# Patient Record
Sex: Male | Born: 1955 | Race: White | Hispanic: No | Marital: Married | Smoking: Current every day smoker
Health system: Southern US, Community
[De-identification: ages and names within clinical notes are randomized; demographics above are authoritative.]

## PROBLEM LIST (undated history)

## (undated) DIAGNOSIS — D471 Chronic myeloproliferative disease: Secondary | ICD-10-CM

## (undated) DIAGNOSIS — G8929 Other chronic pain: Secondary | ICD-10-CM

## (undated) DIAGNOSIS — E1140 Type 2 diabetes mellitus with diabetic neuropathy, unspecified: Secondary | ICD-10-CM

## (undated) DIAGNOSIS — F419 Anxiety disorder, unspecified: Secondary | ICD-10-CM

## (undated) DIAGNOSIS — R112 Nausea with vomiting, unspecified: Secondary | ICD-10-CM

## (undated) DIAGNOSIS — E785 Hyperlipidemia, unspecified: Secondary | ICD-10-CM

## (undated) DIAGNOSIS — E119 Type 2 diabetes mellitus without complications: Secondary | ICD-10-CM

## (undated) DIAGNOSIS — I10 Essential (primary) hypertension: Secondary | ICD-10-CM

## (undated) DIAGNOSIS — M1990 Unspecified osteoarthritis, unspecified site: Secondary | ICD-10-CM

## (undated) DIAGNOSIS — Z9889 Other specified postprocedural states: Secondary | ICD-10-CM

## (undated) DIAGNOSIS — M5030 Other cervical disc degeneration, unspecified cervical region: Secondary | ICD-10-CM

## (undated) DIAGNOSIS — C649 Malignant neoplasm of unspecified kidney, except renal pelvis: Secondary | ICD-10-CM

## (undated) DIAGNOSIS — M549 Dorsalgia, unspecified: Secondary | ICD-10-CM

---

## 2004-10-11 ENCOUNTER — Emergency Department (HOSPITAL_COMMUNITY): Admission: EM | Admit: 2004-10-11 | Discharge: 2004-10-11 | Payer: Self-pay | Admitting: Family Medicine

## 2014-03-04 ENCOUNTER — Encounter (HOSPITAL_COMMUNITY): Payer: Self-pay | Admitting: Pharmacy Technician

## 2014-03-07 ENCOUNTER — Other Ambulatory Visit: Payer: Self-pay

## 2014-03-07 ENCOUNTER — Encounter (HOSPITAL_COMMUNITY)
Admission: RE | Admit: 2014-03-07 | Discharge: 2014-03-07 | Disposition: A | Discharge status: Home / Self Care | Payer: BC Managed Care – PPO | Source: Ambulatory Visit | Attending: Ophthalmology | Admitting: Ophthalmology

## 2014-03-07 ENCOUNTER — Encounter (HOSPITAL_COMMUNITY): Payer: Self-pay

## 2014-03-07 DIAGNOSIS — Z01810 Encounter for preprocedural cardiovascular examination: Secondary | ICD-10-CM | POA: Insufficient documentation

## 2014-03-07 DIAGNOSIS — Z01812 Encounter for preprocedural laboratory examination: Secondary | ICD-10-CM | POA: Insufficient documentation

## 2014-03-07 HISTORY — DX: Unspecified osteoarthritis, unspecified site: M19.90

## 2014-03-07 HISTORY — DX: Hyperlipidemia, unspecified: E78.5

## 2014-03-07 HISTORY — DX: Type 2 diabetes mellitus with diabetic neuropathy, unspecified: E11.40

## 2014-03-07 HISTORY — DX: Type 2 diabetes mellitus without complications: E11.9

## 2014-03-07 HISTORY — DX: Essential (primary) hypertension: I10

## 2014-03-07 LAB — BASIC METABOLIC PANEL
BUN: 11 mg/dL (ref 6–23)
CHLORIDE: 100 meq/L (ref 96–112)
CO2: 25 mEq/L (ref 19–32)
Calcium: 9.7 mg/dL (ref 8.4–10.5)
Creatinine, Ser: 0.68 mg/dL (ref 0.50–1.35)
GFR calc non Af Amer: >90 mL/min (ref >90)
Glucose, Bld: 160 mg/dL — ABNORMAL HIGH (ref 70–99)
POTASSIUM: 4.9 meq/L (ref 3.7–5.3)
Sodium: 139 mEq/L (ref 137–147)

## 2014-03-07 LAB — HEMOGLOBIN AND HEMATOCRIT, BLOOD
HEMATOCRIT: 44.8 % (ref 39.0–52.0)
HEMOGLOBIN: 15.2 g/dL (ref 13.0–17.0)

## 2014-03-07 NOTE — Patient Instructions (Signed)
Tyrone Cohen  03/07/2014   Your procedure is scheduled on:  03/14/14  Report to [facility] at [place] AM.  Call this number if you have problems the morning of surgery: [phone]   Remember:   Do not eat food or drink liquids after midnight.   Take these medicines the morning of surgery with A SIP OF WATER: lisinopril, oxycodone   Do not wear jewelry, make-up or nail polish.  Do not wear lotions, powders, or perfumes. You may wear deodorant.  Do not shave 48 hours prior to surgery. Men may shave face and neck.  Do not bring valuables to the hospital.  [facility] is not responsible                  for any belongings or valuables.               Contacts, dentures or bridgework may not be worn into surgery.  Leave suitcase in the car. After surgery it may be brought to your room.  For patients admitted to the hospital, discharge time is determined by your                treatment team.               Patients discharged the day of surgery will not be allowed to drive  home.  Name and phone number of your driver: family  Special Instructions: Shower using CHG 2 nights before surgery and the night before surgery.  If you shower the day of surgery use CHG.  Use special wash - you have one bottle of CHG for all showers.  You should use approximately 1/3 of the bottle for each shower.   Please read over the following fact sheets that you were given: Surgical Site Infection Prevention, Anesthesia Post-op Instructions and Care and Recovery After Surgery   PATIENT INSTRUCTIONS POST-ANESTHESIA  IMMEDIATELY FOLLOWING SURGERY:  Do not drive or operate machinery for the first twenty four hours after surgery.  Do not make any important decisions for twenty four hours after surgery or while taking narcotic pain medications or sedatives.  If you develop intractable nausea and vomiting or a severe headache please notify your doctor immediately.  FOLLOW-UP:  Please make an appointment with your surgeon  as instructed. You do not need to follow up with anesthesia unless specifically instructed to do so.  WOUND CARE INSTRUCTIONS (if applicable):  Keep a dry clean dressing on the anesthesia/puncture wound site if there is drainage.  Once the wound has quit draining you may leave it open to air.  Generally you should leave the bandage intact for twenty four hours unless there is drainage.  If the epidural site drains for more than 36-48 hours please call the anesthesia department.  QUESTIONS?:  Please feel free to call your physician or the hospital operator if you have any questions, and they will be happy to assist you.      Cataract Surgery  A cataract is a clouding of the lens of the eye. When a lens becomes cloudy, vision is reduced based on the degree and nature of the clouding. Surgery may be needed to improve vision. Surgery removes the cloudy lens and usually replaces it with a substitute lens (intraocular lens, IOL). LET YOUR EYE DOCTOR KNOW ABOUT:  Allergies to food or medicine.  Medicines taken including herbs, eyedrops, over-the-counter medicines, and creams.  Use of steroids (by mouth or creams).  Previous problems with anesthetics or  numbing medicine.  History of bleeding problems or blood clots.  Previous surgery.  Other health problems, including diabetes and kidney problems.  Possibility of pregnancy, if this applies. RISKS AND COMPLICATIONS  Infection.  Inflammation of the eyeball (endophthalmitis) that can spread to both eyes (sympathetic ophthalmia).  Poor wound healing.  If an IOL is inserted, it can later fall out of proper position. This is very uncommon.  Clouding of the part of your eye that holds an IOL in place. This is called an "after-cataract." These are uncommon, but easily treated. BEFORE THE PROCEDURE  Do not eat or drink anything except small amounts of water for 8 to 12 before your surgery, or as directed by your caregiver.  Unless you are  told otherwise, continue any eyedrops you have been prescribed.  Talk to your primary caregiver about all other medicines that you take (both prescription and non-prescription). In some cases, you may need to stop or change medicines near the time of your surgery. This is most important if you are taking blood-thinning medicine.Do not stop medicines unless you are told to do so.  Arrange for someone to drive you to and from the procedure.  Do not put contact lenses in either eye on the day of your surgery. PROCEDURE There is more than one method for safely removing a cataract. Your doctor can explain the differences and help determine which is best for you. Phacoemulsification surgery is the most common form of cataract surgery.  An injection is given behind the eye or eyedrops are given to make this a painless procedure.  A small cut (incision) is made on the edge of the clear, dome-shaped surface that covers the front of the eye (cornea).  A tiny probe is painlessly inserted into the eye. This device gives off ultrasound waves that soften and break up the cloudy center of the lens. This makes it easier for the cloudy lens to be removed by suction.  An IOL may be implanted.  The normal lens of the eye is covered by a clear capsule. Part of that capsule is intentionally left in the eye to support the IOL.  Your surgeon may or may not use stitches to close the incision. There are other forms of cataract surgery that require a larger incision and stiches to close the eye. This approach is taken in cases where the doctor feels that the cataract cannot be easily removed using phacoemulsification. AFTER THE PROCEDURE  When an IOL is implanted, it does not need care. It becomes a permanent part of your eye and cannot be seen or felt.  Your doctor will schedule follow-up exams to check on your progress.  Review your other medicines with your doctor to see which can be resumed after  surgery.  Use eyedrops or take medicine as prescribed by your doctor. Document Released: 08/29/2011 Document Revised: 12/02/2011 Document Reviewed: 08/29/2011 [facility] Patient Information 2014 [place], [place].

## 2014-03-07 NOTE — Pre-Procedure Instructions (Signed)
Pt. Given info for My Chart to be set up at home.

## 2014-03-14 ENCOUNTER — Encounter (HOSPITAL_COMMUNITY): Payer: Self-pay | Admitting: Ophthalmology

## 2014-03-14 ENCOUNTER — Encounter (HOSPITAL_COMMUNITY)
Admission: RE | Disposition: A | Discharge status: Home / Self Care | Payer: Self-pay | Source: Ambulatory Visit | Attending: Ophthalmology

## 2014-03-14 ENCOUNTER — Ambulatory Visit (HOSPITAL_COMMUNITY): Payer: BC Managed Care – PPO | Admitting: Anesthesiology

## 2014-03-14 ENCOUNTER — Ambulatory Visit (HOSPITAL_COMMUNITY)
Admission: RE | Admit: 2014-03-14 | Discharge: 2014-03-14 | Disposition: A | Discharge status: Home / Self Care | Payer: BC Managed Care – PPO | Source: Ambulatory Visit | Attending: Ophthalmology | Admitting: Ophthalmology

## 2014-03-14 ENCOUNTER — Encounter (HOSPITAL_COMMUNITY): Payer: BC Managed Care – PPO | Admitting: Anesthesiology

## 2014-03-14 DIAGNOSIS — H269 Unspecified cataract: Secondary | ICD-10-CM | POA: Diagnosis present

## 2014-03-14 DIAGNOSIS — I10 Essential (primary) hypertension: Secondary | ICD-10-CM | POA: Diagnosis not present

## 2014-03-14 DIAGNOSIS — F17200 Nicotine dependence, unspecified, uncomplicated: Secondary | ICD-10-CM | POA: Insufficient documentation

## 2014-03-14 DIAGNOSIS — E119 Type 2 diabetes mellitus without complications: Secondary | ICD-10-CM | POA: Diagnosis not present

## 2014-03-14 DIAGNOSIS — IMO0002 Reserved for concepts with insufficient information to code with codable children: Secondary | ICD-10-CM | POA: Insufficient documentation

## 2014-03-14 HISTORY — PX: CATARACT EXTRACTION W/PHACO: SHX586

## 2014-03-14 LAB — GLUCOSE, CAPILLARY: GLUCOSE-CAPILLARY: 211 mg/dL — AB (ref 70–99)

## 2014-03-14 SURGERY — PHACOEMULSIFICATION, CATARACT, WITH IOL INSERTION
Anesthesia: Monitor Anesthesia Care | Site: Eye | Laterality: Left

## 2014-03-14 MED ORDER — MIDAZOLAM HCL 2 MG/2ML IJ SOLN
1.0000 mg | INTRAMUSCULAR | Status: DC | PRN
  Administered 2014-03-14 (×2): 1 mg via INTRAVENOUS

## 2014-03-14 MED ORDER — TETRACAINE HCL 0.5 % OP SOLN
1.0000 [drp] | OPHTHALMIC | Status: AC | PRN
  Administered 2014-03-14 (×3): 1 [drp] via OPHTHALMIC

## 2014-03-14 MED ORDER — EPINEPHRINE HCL 1 MG/ML IJ SOLN
INTRAOCULAR | Status: DC | PRN
  Administered 2014-03-14: 07:00:00

## 2014-03-14 MED ORDER — CYCLOPENTOLATE-PHENYLEPHRINE 0.2-1 % OP SOLN
1.0000 [drp] | OPHTHALMIC | Status: AC | PRN
  Administered 2014-03-14 (×3): 1 [drp] via OPHTHALMIC

## 2014-03-14 MED ORDER — LIDOCAINE HCL 3.5 % OP GEL
OPHTHALMIC | Status: AC
  Filled 2014-03-14: qty 1

## 2014-03-14 MED ORDER — PROVISC 10 MG/ML IO SOLN
INTRAOCULAR | Status: DC | PRN
  Administered 2014-03-14: 0.85 mL via INTRAOCULAR

## 2014-03-14 MED ORDER — POVIDONE-IODINE 5 % OP SOLN
OPHTHALMIC | Status: DC | PRN
  Administered 2014-03-14: 1 via OPHTHALMIC

## 2014-03-14 MED ORDER — FENTANYL CITRATE 0.05 MG/ML IJ SOLN
25.0000 ug | INTRAMUSCULAR | Status: AC
  Administered 2014-03-14 (×2): 25 ug via INTRAVENOUS

## 2014-03-14 MED ORDER — NEOMYCIN-POLYMYXIN-DEXAMETH 3.5-10000-0.1 OP SUSP
OPHTHALMIC | Status: DC | PRN
  Administered 2014-03-14: 2 [drp] via OPHTHALMIC

## 2014-03-14 MED ORDER — LIDOCAINE HCL (PF) 1 % IJ SOLN
INTRAMUSCULAR | Status: DC | PRN
  Administered 2014-03-14: .4 mL

## 2014-03-14 MED ORDER — PHENYLEPHRINE HCL 2.5 % OP SOLN
1.0000 [drp] | OPHTHALMIC | Status: AC | PRN
  Administered 2014-03-14 (×3): 1 [drp] via OPHTHALMIC

## 2014-03-14 MED ORDER — EPINEPHRINE HCL 1 MG/ML IJ SOLN
INTRAMUSCULAR | Status: AC
  Filled 2014-03-14: qty 1

## 2014-03-14 MED ORDER — LIDOCAINE HCL (PF) 1 % IJ SOLN
INTRAMUSCULAR | Status: AC
  Filled 2014-03-14: qty 2

## 2014-03-14 MED ORDER — PHENYLEPHRINE HCL 2.5 % OP SOLN
OPHTHALMIC | Status: AC
  Filled 2014-03-14: qty 15

## 2014-03-14 MED ORDER — BSS IO SOLN
INTRAOCULAR | Status: DC | PRN
  Administered 2014-03-14: 15 mL via INTRAOCULAR

## 2014-03-14 MED ORDER — LACTATED RINGERS IV SOLN
INTRAVENOUS | Status: DC
  Administered 2014-03-14: 1000 mL via INTRAVENOUS

## 2014-03-14 MED ORDER — LIDOCAINE 3.5 % OP GEL OPTIME - NO CHARGE
OPHTHALMIC | Status: DC | PRN
  Administered 2014-03-14: 2 [drp] via OPHTHALMIC

## 2014-03-14 MED ORDER — MIDAZOLAM HCL 2 MG/2ML IJ SOLN
INTRAMUSCULAR | Status: AC
  Filled 2014-03-14: qty 2

## 2014-03-14 MED ORDER — NEOMYCIN-POLYMYXIN-DEXAMETH 3.5-10000-0.1 OP SUSP
OPHTHALMIC | Status: AC
  Filled 2014-03-14: qty 5

## 2014-03-14 MED ORDER — CYCLOPENTOLATE-PHENYLEPHRINE OP SOLN OPTIME - NO CHARGE
OPHTHALMIC | Status: AC
  Filled 2014-03-14: qty 2

## 2014-03-14 MED ORDER — TETRACAINE HCL 0.5 % OP SOLN
OPHTHALMIC | Status: AC
  Filled 2014-03-14: qty 2

## 2014-03-14 MED ORDER — LIDOCAINE HCL 3.5 % OP GEL
1.0000 "application " | Freq: Once | OPHTHALMIC | Status: AC
  Administered 2014-03-14: 1 via OPHTHALMIC

## 2014-03-14 MED ORDER — FENTANYL CITRATE 0.05 MG/ML IJ SOLN
INTRAMUSCULAR | Status: AC
  Filled 2014-03-14: qty 2

## 2014-03-14 SURGICAL SUPPLY — 34 items
CAPSULAR TENSION RING-AMO (OPHTHALMIC RELATED) IMPLANT
CLOTH BEACON ORANGE TIMEOUT ST (SAFETY) ×2 IMPLANT
EYE SHIELD UNIVERSAL CLEAR (GAUZE/BANDAGES/DRESSINGS) ×2 IMPLANT
GLOVE BIO SURGEON STRL SZ 6.5 (GLOVE) IMPLANT
GLOVE BIO SURGEONS STRL SZ 6.5 (GLOVE)
GLOVE BIOGEL PI IND STRL 6.5 (GLOVE) IMPLANT
GLOVE BIOGEL PI IND STRL 7.0 (GLOVE) IMPLANT
GLOVE BIOGEL PI IND STRL 7.5 (GLOVE) IMPLANT
GLOVE BIOGEL PI INDICATOR 6.5 (GLOVE) ×2
GLOVE BIOGEL PI INDICATOR 7.0 (GLOVE)
GLOVE BIOGEL PI INDICATOR 7.5 (GLOVE)
GLOVE ECLIPSE 6.5 STRL STRAW (GLOVE) IMPLANT
GLOVE ECLIPSE 7.0 STRL STRAW (GLOVE) IMPLANT
GLOVE ECLIPSE 7.5 STRL STRAW (GLOVE) IMPLANT
GLOVE EXAM NITRILE LRG STRL (GLOVE) IMPLANT
GLOVE EXAM NITRILE MD LF STRL (GLOVE) IMPLANT
GLOVE SKINSENSE NS SZ6.5 (GLOVE)
GLOVE SKINSENSE NS SZ7.0 (GLOVE)
GLOVE SKINSENSE STRL SZ6.5 (GLOVE) IMPLANT
GLOVE SKINSENSE STRL SZ7.0 (GLOVE) IMPLANT
GLOVE SS N UNI LF 7.0 STRL (GLOVE) ×2 IMPLANT
KIT VITRECTOMY (OPHTHALMIC RELATED) IMPLANT
PAD ARMBOARD 7.5X6 YLW CONV (MISCELLANEOUS) ×2 IMPLANT
PROC W NO LENS (INTRAOCULAR LENS)
PROC W SPEC LENS (INTRAOCULAR LENS)
PROCESS W NO LENS (INTRAOCULAR LENS) IMPLANT
PROCESS W SPEC LENS (INTRAOCULAR LENS) IMPLANT
RING MALYGIN (MISCELLANEOUS) IMPLANT
SIGHTPATH CAT PROC W REG LENS (Ophthalmic Related) ×3 IMPLANT
SYR TB 1ML LL NO SAFETY (SYRINGE) ×2 IMPLANT
TAPE SURG TRANSPORE 1 IN (GAUZE/BANDAGES/DRESSINGS) IMPLANT
TAPE SURGICAL TRANSPORE 1 IN (GAUZE/BANDAGES/DRESSINGS) ×2
VISCOELASTIC ADDITIONAL (OPHTHALMIC RELATED) IMPLANT
WATER STERILE IRR 250ML POUR (IV SOLUTION) ×2 IMPLANT

## 2014-03-14 NOTE — Anesthesia Procedure Notes (Signed)
Procedure Name: MAC Date/Time: 03/14/2014 7:25 AM Performed by: Vista Deck Pre-anesthesia Checklist: Patient identified, Emergency Drugs available, Suction available, Timeout performed and Patient being monitored Patient Re-evaluated:Patient Re-evaluated prior to inductionOxygen Delivery Method: Nasal Cannula

## 2014-03-14 NOTE — Op Note (Signed)
Date of Admission: 03/14/2014  Date of Surgery: 03/14/2014   Pre-Op Dx: Cataract Left Eye  Post-Op Dx: Cataract Left  Eye,  Dx Code 619.50   Surgeon: Tonny Branch, M.D.  Assistants: None  Anesthesia: Topical with MAC  Indications: Painless, progressive loss of vision with compromise of daily activities.  Surgery: Cataract Extraction with Intraocular lens Implant Left Eye  Discription: The patient had dilating drops and viscous lidocaine placed into the Left eye in the pre-op holding area. After transfer to the operating room, a time out was performed. The patient was then prepped and draped. Beginning with a 44 degree blade a paracentesis port was made at the surgeon's 2 o'clock position. The anterior chamber was then filled with 1% non-preserved lidocaine. This was followed by filling the anterior chamber with Provisc.  A 2.69mm keratome blade was used to make a clear corneal incision at the temporal limbus.  A bent cystatome needle was used to create a continuous tear capsulotomy. Hydrodissection was performed with balanced salt solution on a Fine canula. The lens nucleus was then removed using the phacoemulsification handpiece. Residual cortex was removed with the I&A handpiece. The anterior chamber and capsular bag were refilled with Provisc. A posterior chamber intraocular lens was placed into the capsular bag with it's injector. The implant was positioned with the Kuglan hook. The Provisc was then removed from the anterior chamber and capsular bag with the I&A handpiece. Stromal hydration of the main incision and paracentesis port was performed with BSS on a Fine canula. The wounds were tested for leak which was negative. The patient tolerated the procedure well. There were no operative complications. The patient was then transferred to the recovery room in stable condition.  Complications: None  Specimen: None  EBL: None  Prosthetic device: Hoya iSert 250, power 23.0 D, SN [id number].

## 2014-03-14 NOTE — Transfer of Care (Signed)
Immediate Anesthesia Transfer of Care Note  Patient: Tyrone Cohen  Procedure(s) Performed: Procedure(s) (LRB): CATARACT EXTRACTION PHACO AND INTRAOCULAR LENS PLACEMENT (IOC) (Left)  Patient Location: Shortstay  Anesthesia Type: MAC  Level of Consciousness: awake  Airway & Oxygen Therapy: Patient Spontanous Breathing   Post-op Assessment: Report given to PACU RN, Post -op Vital signs reviewed and stable and Patient moving all extremities  Post vital signs: Reviewed and stable  Complications: No apparent anesthesia complications

## 2014-03-14 NOTE — Discharge Instructions (Signed)

## 2014-03-14 NOTE — H&P (Signed)
I have reviewed the H&P, the patient was re-examined, and I have identified no interval changes in medical condition and plan of care since the history and physical of record  

## 2014-03-14 NOTE — Anesthesia Preprocedure Evaluation (Signed)
Anesthesia Evaluation  Patient identified by MRN, date of birth, ID band Patient awake    Reviewed: Allergy & Precautions, H&P , NPO status , Patient's Chart, lab work & pertinent test results  Airway Mallampati: I TM Distance: >3 FB     Dental  (+) Edentulous Upper, Edentulous Lower   Pulmonary Current Smoker,  breath sounds clear to auscultation        Cardiovascular hypertension, Pt. on medications Rhythm:Regular Rate:Normal     Neuro/Psych    GI/Hepatic   Endo/Other  diabetes, Type 2, Oral Hypoglycemic Agents  Renal/GU      Musculoskeletal   Abdominal   Peds  Hematology   Anesthesia Other Findings   Reproductive/Obstetrics                           Anesthesia Physical Anesthesia Plan  ASA: III  Anesthesia Plan: MAC   Post-op Pain Management:    Induction: Intravenous  Airway Management Planned: Nasal Cannula  Additional Equipment:   Intra-op Plan:   Post-operative Plan:   Informed Consent: I have reviewed the patients History and Physical, chart, labs and discussed the procedure including the risks, benefits and alternatives for the proposed anesthesia with the patient or authorized representative who has indicated his/her understanding and acceptance.     Plan Discussed with:   Anesthesia Plan Comments:         Anesthesia Quick Evaluation

## 2014-03-14 NOTE — Anesthesia Postprocedure Evaluation (Signed)
  Anesthesia Post-op Note  Patient: Tyrone Cohen  Procedure(s) Performed: Procedure(s) (LRB): CATARACT EXTRACTION PHACO AND INTRAOCULAR LENS PLACEMENT (IOC) (Left)  Patient Location:  Short Stay  Anesthesia Type: MAC  Level of Consciousness: awake  Airway and Oxygen Therapy: Patient Spontanous Breathing  Post-op Pain: none  Post-op Assessment: Post-op Vital signs reviewed, Patient's Cardiovascular Status Stable, Respiratory Function Stable, Patent Airway, No signs of Nausea or vomiting and Pain level controlled  Post-op Vital Signs: Reviewed and stable  Complications: No apparent anesthesia complications

## 2014-03-15 ENCOUNTER — Encounter (HOSPITAL_COMMUNITY): Payer: Self-pay | Admitting: Ophthalmology

## 2014-09-01 NOTE — Patient Instructions (Signed)
Your procedure is scheduled on:  09/05/14  Report to [facility] at 08:30 AM.  Call this number if you have problems the morning of surgery: [phone]   Remember:   Do not eat food or drink liquids after midnight.   Take these medicines the morning of surgery with A SIP OF WATER: Gabapentin and Lisinopril. You may take your Methacarbamol and Oxycodone if needed.   Do not wear jewelry, make-up or nail polish.  Do not wear lotions, powders, or perfumes. You may wear deodorant.  Do not bring valuables to the hospital.  [facility] is not responsible for any belongings or valuables.               Contacts, dentures or bridgework may not be worn into surgery.               Patients discharged the day of surgery will not be allowed to drive home.   Special Instructions: Start using your eye drops prior to surgery as directed by your eye doctor.   Please read over the following fact sheets that you were given: Anesthesia Post-op Instructions and Care and Recovery After Surgery     Cataract Surgery  A cataract is a clouding of the lens of the eye. When a lens becomes cloudy, vision is reduced based on the degree and nature of the clouding. Surgery may be needed to improve vision. Surgery removes the cloudy lens and usually replaces it with a substitute lens (intraocular lens, IOL). LET YOUR EYE DOCTOR KNOW ABOUT:  Allergies to food or medicine.  Medicines taken including herbs, eyedrops, over-the-counter medicines, and creams.  Use of steroids (by mouth or creams).  Previous problems with anesthetics or numbing medicine.  History of bleeding problems or blood clots.  Previous surgery.  Other health problems, including diabetes and kidney problems.  Possibility of pregnancy, if this applies. RISKS AND COMPLICATIONS  Infection.  Inflammation of the eyeball (endophthalmitis) that can spread to both eyes (sympathetic ophthalmia).  Poor wound healing.  If an IOL is inserted, it  can later fall out of proper position. This is very uncommon.  Clouding of the part of your eye that holds an IOL in place. This is called an "after-cataract." These are uncommon, but easily treated. BEFORE THE PROCEDURE  Do not eat or drink anything except small amounts of water for 8 to 12 before your surgery, or as directed by your caregiver.  Unless you are told otherwise, continue any eyedrops you have been prescribed.  Talk to your primary caregiver about all other medicines that you take (both prescription and non-prescription). In some cases, you may need to stop or change medicines near the time of your surgery. This is most important if you are taking blood-thinning medicine.Do not stop medicines unless you are told to do so.  Arrange for someone to drive you to and from the procedure.  Do not put contact lenses in either eye on the day of your surgery. PROCEDURE There is more than one method for safely removing a cataract. Your doctor can explain the differences and help determine which is best for you. Phacoemulsification surgery is the most common form of cataract surgery.  An injection is given behind the eye or eyedrops are given to make this a painless procedure.  A small cut (incision) is made on the edge of the clear, dome-shaped surface that covers the front of the eye (cornea).  A tiny probe is painlessly inserted into the eye. This  device gives off ultrasound waves that soften and break up the cloudy center of the lens. This makes it easier for the cloudy lens to be removed by suction.  An IOL may be implanted.  The normal lens of the eye is covered by a clear capsule. Part of that capsule is intentionally left in the eye to support the IOL.  Your surgeon may or may not use stitches to close the incision. There are other forms of cataract surgery that require a larger incision and stiches to close the eye. This approach is taken in cases where the doctor feels that  the cataract cannot be easily removed using phacoemulsification. AFTER THE PROCEDURE  When an IOL is implanted, it does not need care. It becomes a permanent part of your eye and cannot be seen or felt.  Your doctor will schedule follow-up exams to check on your progress.  Review your other medicines with your doctor to see which can be resumed after surgery.  Use eyedrops or take medicine as prescribed by your doctor. Document Released: 08/29/2011 Document Revised: 12/02/2011 Document Reviewed: 08/29/2011 [facility] Patient Information 2013 [place].    PATIENT INSTRUCTIONS POST-ANESTHESIA  IMMEDIATELY FOLLOWING SURGERY:  Do not drive or operate machinery for the first twenty four hours after surgery.  Do not make any important decisions for twenty four hours after surgery or while taking narcotic pain medications or sedatives.  If you develop intractable nausea and vomiting or a severe headache please notify your doctor immediately.  FOLLOW-UP:  Please make an appointment with your surgeon as instructed. You do not need to follow up with anesthesia unless specifically instructed to do so.  WOUND CARE INSTRUCTIONS (if applicable):  Keep a dry clean dressing on the anesthesia/puncture wound site if there is drainage.  Once the wound has quit draining you may leave it open to air.  Generally you should leave the bandage intact for twenty four hours unless there is drainage.  If the epidural site drains for more than 36-48 hours please call the anesthesia department.  QUESTIONS?:  Please feel free to call your physician or the hospital operator if you have any questions, and they will be happy to assist you.

## 2014-09-02 ENCOUNTER — Encounter (HOSPITAL_COMMUNITY): Payer: Self-pay

## 2014-09-02 ENCOUNTER — Encounter (HOSPITAL_COMMUNITY)
Admission: RE | Admit: 2014-09-02 | Discharge: 2014-09-02 | Disposition: A | Discharge status: Home / Self Care | Payer: BC Managed Care – PPO | Source: Ambulatory Visit | Attending: Ophthalmology | Admitting: Ophthalmology

## 2014-09-02 DIAGNOSIS — H269 Unspecified cataract: Secondary | ICD-10-CM | POA: Diagnosis present

## 2014-09-02 DIAGNOSIS — I10 Essential (primary) hypertension: Secondary | ICD-10-CM | POA: Diagnosis not present

## 2014-09-02 DIAGNOSIS — E119 Type 2 diabetes mellitus without complications: Secondary | ICD-10-CM | POA: Diagnosis not present

## 2014-09-02 DIAGNOSIS — F17200 Nicotine dependence, unspecified, uncomplicated: Secondary | ICD-10-CM | POA: Diagnosis not present

## 2014-09-02 HISTORY — DX: Anxiety disorder, unspecified: F41.9

## 2014-09-02 LAB — BASIC METABOLIC PANEL
ANION GAP: 15 (ref 5–15)
BUN: 14 mg/dL (ref 6–23)
CO2: 27 mEq/L (ref 19–32)
CREATININE: 0.79 mg/dL (ref 0.50–1.35)
Calcium: 10.6 mg/dL — ABNORMAL HIGH (ref 8.4–10.5)
Chloride: 95 mEq/L — ABNORMAL LOW (ref 96–112)
Glucose, Bld: 191 mg/dL — ABNORMAL HIGH (ref 70–99)
Potassium: 5.5 mEq/L — ABNORMAL HIGH (ref 3.7–5.3)
Sodium: 137 mEq/L (ref 137–147)

## 2014-09-02 LAB — HEMOGLOBIN AND HEMATOCRIT, BLOOD
HCT: 43.7 % (ref 39.0–52.0)
Hemoglobin: 14.7 g/dL (ref 13.0–17.0)

## 2014-09-02 MED ORDER — LIDOCAINE HCL (PF) 1 % IJ SOLN
INTRAMUSCULAR | Status: AC
  Filled 2014-09-02: qty 2

## 2014-09-02 MED ORDER — TETRACAINE HCL 0.5 % OP SOLN
OPHTHALMIC | Status: AC
  Filled 2014-09-02: qty 2

## 2014-09-02 MED ORDER — PHENYLEPHRINE HCL 2.5 % OP SOLN
OPHTHALMIC | Status: AC
  Filled 2014-09-02: qty 15

## 2014-09-02 MED ORDER — LIDOCAINE HCL 3.5 % OP GEL
OPHTHALMIC | Status: AC
  Filled 2014-09-02: qty 1

## 2014-09-02 MED ORDER — NEOMYCIN-POLYMYXIN-DEXAMETH 3.5-10000-0.1 OP SUSP
OPHTHALMIC | Status: AC
  Filled 2014-09-02: qty 5

## 2014-09-02 MED ORDER — CYCLOPENTOLATE-PHENYLEPHRINE OP SOLN OPTIME - NO CHARGE
OPHTHALMIC | Status: AC
  Filled 2014-09-02: qty 2

## 2014-09-02 NOTE — Pre-Procedure Instructions (Signed)
Patient given information to sign up for my chart at home. 

## 2014-09-05 ENCOUNTER — Encounter (HOSPITAL_COMMUNITY): Payer: Self-pay | Admitting: *Deleted

## 2014-09-05 ENCOUNTER — Encounter (HOSPITAL_COMMUNITY)
Admission: RE | Disposition: A | Discharge status: Home / Self Care | Payer: Self-pay | Source: Ambulatory Visit | Attending: Ophthalmology

## 2014-09-05 ENCOUNTER — Ambulatory Visit (HOSPITAL_COMMUNITY): Payer: BC Managed Care – PPO | Admitting: Anesthesiology

## 2014-09-05 ENCOUNTER — Ambulatory Visit (HOSPITAL_COMMUNITY)
Admission: RE | Admit: 2014-09-05 | Discharge: 2014-09-05 | Disposition: A | Discharge status: Home / Self Care | Payer: BC Managed Care – PPO | Source: Ambulatory Visit | Attending: Ophthalmology | Admitting: Ophthalmology

## 2014-09-05 DIAGNOSIS — H269 Unspecified cataract: Secondary | ICD-10-CM | POA: Diagnosis not present

## 2014-09-05 DIAGNOSIS — E119 Type 2 diabetes mellitus without complications: Secondary | ICD-10-CM | POA: Insufficient documentation

## 2014-09-05 DIAGNOSIS — I10 Essential (primary) hypertension: Secondary | ICD-10-CM | POA: Insufficient documentation

## 2014-09-05 DIAGNOSIS — F17200 Nicotine dependence, unspecified, uncomplicated: Secondary | ICD-10-CM | POA: Insufficient documentation

## 2014-09-05 HISTORY — PX: CATARACT EXTRACTION W/PHACO: SHX586

## 2014-09-05 LAB — GLUCOSE, CAPILLARY: Glucose-Capillary: 260 mg/dL — ABNORMAL HIGH (ref 70–99)

## 2014-09-05 SURGERY — PHACOEMULSIFICATION, CATARACT, WITH IOL INSERTION
Anesthesia: Monitor Anesthesia Care | Site: Eye | Laterality: Right

## 2014-09-05 MED ORDER — BSS IO SOLN
INTRAOCULAR | Status: DC | PRN
  Administered 2014-09-05: 30 mL via INTRAOCULAR

## 2014-09-05 MED ORDER — PROVISC 10 MG/ML IO SOLN
INTRAOCULAR | Status: DC | PRN
  Administered 2014-09-05: 0.85 mL via INTRAOCULAR

## 2014-09-05 MED ORDER — EPINEPHRINE HCL 1 MG/ML IJ SOLN
INTRAMUSCULAR | Status: AC
  Filled 2014-09-05: qty 1

## 2014-09-05 MED ORDER — POVIDONE-IODINE 5 % OP SOLN
OPHTHALMIC | Status: DC | PRN
  Administered 2014-09-05: 1 via OPHTHALMIC

## 2014-09-05 MED ORDER — LACTATED RINGERS IV SOLN
INTRAVENOUS | Status: DC | PRN
  Administered 2014-09-05: 09:00:00 via INTRAVENOUS

## 2014-09-05 MED ORDER — LIDOCAINE HCL 3.5 % OP GEL
1.0000 "application " | Freq: Once | OPHTHALMIC | Status: AC
  Administered 2014-09-05: 1 via OPHTHALMIC

## 2014-09-05 MED ORDER — MIDAZOLAM HCL 2 MG/2ML IJ SOLN
1.0000 mg | INTRAMUSCULAR | Status: DC | PRN
  Administered 2014-09-05: 2 mg via INTRAVENOUS
  Filled 2014-09-05: qty 2

## 2014-09-05 MED ORDER — BSS IO SOLN
INTRAOCULAR | Status: DC | PRN
  Administered 2014-09-05: 10:00:00

## 2014-09-05 MED ORDER — FENTANYL CITRATE 0.05 MG/ML IJ SOLN
25.0000 ug | INTRAMUSCULAR | Status: AC
  Administered 2014-09-05 (×2): 25 ug via INTRAVENOUS
  Filled 2014-09-05: qty 2

## 2014-09-05 MED ORDER — LIDOCAINE HCL (PF) 1 % IJ SOLN
INTRAMUSCULAR | Status: DC | PRN
  Administered 2014-09-05: .5 mL

## 2014-09-05 MED ORDER — CYCLOPENTOLATE-PHENYLEPHRINE 0.2-1 % OP SOLN
1.0000 [drp] | OPHTHALMIC | Status: AC
  Administered 2014-09-05 (×3): 1 [drp] via OPHTHALMIC

## 2014-09-05 MED ORDER — PHENYLEPHRINE HCL 2.5 % OP SOLN
1.0000 [drp] | OPHTHALMIC | Status: AC
  Administered 2014-09-05 (×3): 1 [drp] via OPHTHALMIC

## 2014-09-05 MED ORDER — LACTATED RINGERS IV SOLN
INTRAVENOUS | Status: DC
  Administered 2014-09-05: 09:00:00 via INTRAVENOUS

## 2014-09-05 MED ORDER — LIDOCAINE 3.5 % OP GEL OPTIME - NO CHARGE
OPHTHALMIC | Status: DC | PRN
  Administered 2014-09-05: 1 [drp] via OPHTHALMIC

## 2014-09-05 MED ORDER — NEOMYCIN-POLYMYXIN-DEXAMETH 3.5-10000-0.1 OP SUSP
OPHTHALMIC | Status: DC | PRN
  Administered 2014-09-05: 1 [drp] via OPHTHALMIC

## 2014-09-05 MED ORDER — TETRACAINE HCL 0.5 % OP SOLN
1.0000 [drp] | OPHTHALMIC | Status: AC
  Administered 2014-09-05 (×3): 1 [drp] via OPHTHALMIC

## 2014-09-05 SURGICAL SUPPLY — 34 items
CAPSULAR TENSION RING-AMO (OPHTHALMIC RELATED) IMPLANT
CLOTH BEACON ORANGE TIMEOUT ST (SAFETY) ×2 IMPLANT
EYE SHIELD UNIVERSAL CLEAR (GAUZE/BANDAGES/DRESSINGS) ×2 IMPLANT
GLOVE BIO SURGEON STRL SZ 6.5 (GLOVE) IMPLANT
GLOVE BIO SURGEONS STRL SZ 6.5 (GLOVE)
GLOVE BIOGEL PI IND STRL 6.5 (GLOVE) IMPLANT
GLOVE BIOGEL PI IND STRL 7.0 (GLOVE) IMPLANT
GLOVE BIOGEL PI IND STRL 7.5 (GLOVE) IMPLANT
GLOVE BIOGEL PI INDICATOR 6.5 (GLOVE) ×4
GLOVE BIOGEL PI INDICATOR 7.0 (GLOVE)
GLOVE BIOGEL PI INDICATOR 7.5 (GLOVE)
GLOVE ECLIPSE 6.5 STRL STRAW (GLOVE) IMPLANT
GLOVE ECLIPSE 7.0 STRL STRAW (GLOVE) IMPLANT
GLOVE ECLIPSE 7.5 STRL STRAW (GLOVE) IMPLANT
GLOVE EXAM NITRILE LRG STRL (GLOVE) IMPLANT
GLOVE EXAM NITRILE MD LF STRL (GLOVE) IMPLANT
GLOVE SKINSENSE NS SZ6.5 (GLOVE)
GLOVE SKINSENSE NS SZ7.0 (GLOVE)
GLOVE SKINSENSE STRL SZ6.5 (GLOVE) IMPLANT
GLOVE SKINSENSE STRL SZ7.0 (GLOVE) IMPLANT
KIT VITRECTOMY (OPHTHALMIC RELATED) IMPLANT
PAD ARMBOARD 7.5X6 YLW CONV (MISCELLANEOUS) ×2 IMPLANT
PROC W NO LENS (INTRAOCULAR LENS)
PROC W SPEC LENS (INTRAOCULAR LENS)
PROCESS W NO LENS (INTRAOCULAR LENS) IMPLANT
PROCESS W SPEC LENS (INTRAOCULAR LENS) IMPLANT
RETRACTOR IRIS SIGHTPATH (OPHTHALMIC RELATED) IMPLANT
RING MALYGIN (MISCELLANEOUS) IMPLANT
SIGHTPATH CAT PROC W REG LENS (Ophthalmic Related) ×3 IMPLANT
SYRINGE LUER LOK 1CC (MISCELLANEOUS) ×2 IMPLANT
TAPE SURG TRANSPORE 1 IN (GAUZE/BANDAGES/DRESSINGS) IMPLANT
TAPE SURGICAL TRANSPORE 1 IN (GAUZE/BANDAGES/DRESSINGS) ×2
VISCOELASTIC ADDITIONAL (OPHTHALMIC RELATED) IMPLANT
WATER STERILE IRR 250ML POUR (IV SOLUTION) ×2 IMPLANT

## 2014-09-05 NOTE — H&P (Signed)
I have reviewed the H&P, the patient was re-examined, and I have identified no interval changes in medical condition and plan of care since the history and physical of record  

## 2014-09-05 NOTE — Anesthesia Postprocedure Evaluation (Signed)
  Anesthesia Post-op Note  Patient: Tyrone Cohen  Procedure(s) Performed: Procedure(s) with comments: CATARACT EXTRACTION PHACO AND INTRAOCULAR LENS PLACEMENT (IOC) (Right) - CDE:10.21  Patient Location: Short Stay  Anesthesia Type:MAC  Level of Consciousness: awake, alert , oriented and patient cooperative  Airway and Oxygen Therapy: Patient Spontanous Breathing  Post-op Pain: none  Post-op Assessment: Post-op Vital signs reviewed, Patient's Cardiovascular Status Stable, Respiratory Function Stable, Patent Airway, No signs of Nausea or vomiting and Pain level controlled  Post-op Vital Signs: Reviewed and stable  Last Vitals:  Filed Vitals:   09/05/14 0935  BP: 118/80  Pulse:   Temp:   Resp: 17    Complications: No apparent anesthesia complications

## 2014-09-05 NOTE — Discharge Instructions (Signed)

## 2014-09-05 NOTE — Op Note (Signed)
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  Assistants: None  Anesthesia: Topical with MAC  Indications: Painless, progressive loss of vision with compromise of daily activities.  Surgery: Cataract Extraction with Intraocular lens Implant Right Eye  Discription: The patient had dilating drops and viscous lidocaine placed into the Right eye in the pre-op holding area. After transfer to the operating room, a time out was performed. The patient was then prepped and draped. Beginning with a 83 degree blade a paracentesis port was made at the surgeon's 2 o'clock position. The anterior chamber was then filled with 1% non-preserved lidocaine. This was followed by filling the anterior chamber with Provisc.  A 2.48mm keratome blade was used to make a clear corneal incision at the temporal limbus.  A bent cystatome needle was used to create a continuous tear capsulotomy. Hydrodissection was performed with balanced salt solution on a Fine canula. The lens nucleus was then removed using the phacoemulsification handpiece. Residual cortex was removed with the I&A handpiece. The anterior chamber and capsular bag were refilled with Provisc. A posterior chamber intraocular lens was placed into the capsular bag with it's injector. The implant was positioned with the Kuglan hook. The Provisc was then removed from the anterior chamber and capsular bag with the I&A handpiece. Stromal hydration of the main incision and paracentesis port was performed with BSS on a Fine canula. The wounds were tested for leak which was negative. The patient tolerated the procedure well. There were no operative complications. The patient was then transferred to the recovery room in stable condition.  Complications: None  Specimen: None  EBL: None  Prosthetic device: Hoya iSert 250, power 24.0  D, SN [id number].

## 2014-09-05 NOTE — Transfer of Care (Signed)
Immediate Anesthesia Transfer of Care Note  Patient: Tyrone Cohen  Procedure(s) Performed: Procedure(s) with comments: CATARACT EXTRACTION PHACO AND INTRAOCULAR LENS PLACEMENT (IOC) (Right) - CDE:10.21  Patient Location: Short Stay  Anesthesia Type:MAC  Level of Consciousness: awake, alert , oriented and patient cooperative  Airway & Oxygen Therapy: Patient Spontanous Breathing  Post-op Assessment: Report given to PACU RN and Post -op Vital signs reviewed and stable  Post vital signs: Reviewed and stable  Complications: No apparent anesthesia complications

## 2014-09-05 NOTE — Anesthesia Procedure Notes (Signed)
Procedure Name: MAC Date/Time: 09/05/2014 9:38 AM Performed by: Andree Elk, Jarely Juncaj A Pre-anesthesia Checklist: Patient identified, Timeout performed, Emergency Drugs available, Suction available and Patient being monitored Oxygen Delivery Method: Nasal cannula

## 2014-09-05 NOTE — Anesthesia Preprocedure Evaluation (Signed)
Anesthesia Evaluation  Patient identified by MRN, date of birth, ID band Patient awake    Reviewed: Allergy & Precautions, H&P , NPO status , Patient's Chart, lab work & pertinent test results  Airway Mallampati: I  TM Distance: >3 FB     Dental  (+) Edentulous Upper, Edentulous Lower   Pulmonary Current Smoker,  breath sounds clear to auscultation        Cardiovascular hypertension, Pt. on medications Rhythm:Regular Rate:Normal     Neuro/Psych    GI/Hepatic   Endo/Other  diabetes, Type 2, Oral Hypoglycemic Agents  Renal/GU      Musculoskeletal   Abdominal   Peds  Hematology   Anesthesia Other Findings   Reproductive/Obstetrics                             Anesthesia Physical Anesthesia Plan  ASA: III  Anesthesia Plan: MAC   Post-op Pain Management:    Induction: Intravenous  Airway Management Planned: Nasal Cannula  Additional Equipment:   Intra-op Plan:   Post-operative Plan:   Informed Consent: I have reviewed the patients History and Physical, chart, labs and discussed the procedure including the risks, benefits and alternatives for the proposed anesthesia with the patient or authorized representative who has indicated his/her understanding and acceptance.     Plan Discussed with:   Anesthesia Plan Comments:         Anesthesia Quick Evaluation

## 2014-09-07 ENCOUNTER — Encounter (HOSPITAL_COMMUNITY): Payer: Self-pay | Admitting: Ophthalmology

## 2017-06-09 LAB — IFOBT (OCCULT BLOOD): IFOBT: POSITIVE

## 2017-06-10 ENCOUNTER — Encounter: Payer: Self-pay | Admitting: Internal Medicine

## 2017-07-30 ENCOUNTER — Encounter: Payer: Self-pay | Admitting: Gastroenterology

## 2017-07-30 ENCOUNTER — Other Ambulatory Visit: Payer: Self-pay

## 2017-07-30 ENCOUNTER — Ambulatory Visit (INDEPENDENT_AMBULATORY_CARE_PROVIDER_SITE_OTHER): Payer: Medicare Other | Admitting: Gastroenterology

## 2017-07-30 DIAGNOSIS — K5903 Drug induced constipation: Secondary | ICD-10-CM | POA: Insufficient documentation

## 2017-07-30 DIAGNOSIS — R195 Other fecal abnormalities: Secondary | ICD-10-CM | POA: Diagnosis not present

## 2017-07-30 DIAGNOSIS — T402X5A Adverse effect of other opioids, initial encounter: Secondary | ICD-10-CM | POA: Diagnosis not present

## 2017-07-30 MED ORDER — NALOXEGOL OXALATE 25 MG PO TABS: 25.0000 mg | ORAL_TABLET | Freq: Every day | ORAL | 5 refills | Status: DC

## 2017-07-30 MED ORDER — NA SULFATE-K SULFATE-MG SULF 17.5-3.13-1.6 GM/177ML PO SOLN
1.0000 | ORAL | 0 refills | Status: DC
Start: 2017-07-30 — End: 2018-03-11

## 2017-07-30 NOTE — Progress Notes (Signed)
Primary Care Physician:  Monico Blitz, MD  Primary Gastroenterologist:  Garfield Cornea, MD   Chief Complaint  Patient presents with  . heme+ stool  . Constipation    HPI:  Tyrone Cohen is a 61 y.o. male here at the request of Dr. Manuella Ghazi for further evaluation of heme positive stool.  Patient states his last colonoscopy was 9 years ago unremarkable.  He has constipation since being on opioids.  Prior to that he had regular BMs.  Now may have 5 days, strains.  No fresh blood per rectum.  Recent Hemoccult was positive done for screening purposes.  Patient has tried and failed stool softeners and MiraLAX.  Patient denies abdominal pain.  His appetite is poor which he relates to his medications. Used to weight 270 at his max over five years ago. Gradual weight loss over several years and got down to 204 pounds. Has been stable at 220 pounds for a couple of years.   Denies heartburn, dysphagia, vomiting.    Current Outpatient Medications  Medication Sig Dispense Refill  . aspirin 325 MG EC tablet Take 325 mg by mouth daily.    Marland Kitchen gabapentin (NEURONTIN) 300 MG capsule Take 600 mg 3 (three) times daily by mouth.     . INVOKAMET XR [phone] MG TB24 Take 1 tablet 2 (two) times daily by mouth.  3  . lisinopril (PRINIVIL,ZESTRIL) 10 MG tablet Take 10 mg by mouth daily.    . methocarbamol (ROBAXIN) 750 MG tablet Take 750 mg 4 (four) times daily by mouth.     . oxyCODONE (ROXICODONE) 15 MG immediate release tablet Take 1 tablet 4 (four) times daily by mouth.  0  . OXYCODONE-ACETAMINOPHEN ER PO Take 15 mg at bedtime by mouth.    . promethazine (PHENERGAN) 12.5 MG tablet Take 1 tablet 4 (four) times daily by mouth.  0  . rosuvastatin (CRESTOR) 20 MG tablet Take 1 tablet daily by mouth.  0  . TRESIBA FLEXTOUCH 200 UNIT/ML SOPN Inject 16 Units daily into the skin.  1  . TRULICITY 1.5 QQ/2.2LN SOPN Inject 1.5 mg once a week into the skin.  1   No current facility-administered medications for this visit.      Allergies as of 07/30/2017  . (No Known Allergies)    Past Medical History:  Diagnosis Date  . Anxiety   . Arthritis   . Diabetes mellitus without complication ([place])   . Diabetic neuropathy ([place])   . Hyperlipidemia   . Hypertension     PSH: cataract surgery    Family History  Problem Relation Age of Onset  . Lung cancer Mother   . Heart attack Father   . Kidney cancer Sister   . Heart attack Brother   . Leukemia Sister   . Cancer Sister        bone marrow  . Colon cancer Neg Hx     Social History   Socioeconomic History  . Marital status: Married    Spouse name: Not on file  . Number of children: Not on file  . Years of education: Not on file  . Highest education level: Not on file  Social Needs  . Financial resource strain: Not on file  . Food insecurity - worry: Not on file  . Food insecurity - inability: Not on file  . Transportation needs - medical: Not on file  . Transportation needs - non-medical: Not on file  Occupational History  . Not on file  Tobacco Use  .  Smoking status: Current Every Day Smoker    Packs/day: 1.00    Years: 40.00    Pack years: 40.00    Types: Cigarettes  . Smokeless tobacco: Never Used  Substance and Sexual Activity  . Alcohol use: Yes    Comment: rare  . Drug use: No  . Sexual activity: Not on file  Other Topics Concern  . Not on file  Social History Narrative  . Not on file      ROS:  General: Negative for anorexia, recent weight loss, fever, chills, fatigue, weakness. Eyes: Negative for vision changes.  ENT: Negative for hoarseness, difficulty swallowing , nasal congestion. CV: Negative for chest pain, angina, palpitations, dyspnea on exertion, peripheral edema.  Respiratory: Negative for dyspnea at rest, dyspnea on exertion, cough, sputum, wheezing.  GI: See history of present illness. GU:  Negative for dysuria, hematuria, urinary incontinence, urinary frequency, nocturnal urination.  MS: Negative for  joint pain,chronic back pain Derm: Negative for rash or itching.  Neuro: Negative for weakness, abnormal sensation, seizure, frequent headaches, memory loss, confusion.  Psych: Negative for anxiety, depression, suicidal ideation, hallucinations.  Endo: Negative for unusual weight change.  Heme: Negative for bruising or bleeding. Allergy: Negative for rash or hives.    Physical Examination:  BP 105/76   Pulse (!) 116   Temp (!) 97.2 F (36.2 C) (Oral)   Ht 6\' 5"  (1.956 m)   Wt 219 lb 12.8 oz (99.7 kg)   BMI 26.06 kg/m    General: Well-nourished, well-developed in no acute distress.  Head: Normocephalic, atraumatic.   Eyes: Conjunctiva pink, no icterus. Mouth: Oropharyngeal mucosa moist and pink , no lesions erythema or exudate. Neck: Supple without thyromegaly, masses, or lymphadenopathy.  Lungs: Clear to auscultation bilaterally.  Heart: Regular rate and rhythm, no murmurs rubs or gallops.  Abdomen: Bowel sounds are normal, nontender, nondistended, no hepatosplenomegaly or masses, no abdominal bruits or    hernia , no rebound or guarding.   Rectal: not performed Extremities: No lower extremity edema. No clubbing or deformities.  Neuro: Alert and oriented x 4 , grossly normal neurologically.  Skin: Warm and dry, no rash or jaundice.   Psych: Alert and cooperative, normal mood and affect.  Labs: Requested.  Imaging Studies: No results found.

## 2017-07-30 NOTE — Patient Instructions (Signed)
1. Colonoscopy to be scheduled.  Please see separate instructions. 2. Please make sure your bowels are moving more regularly and soft prior to starting your bowel prep for colonoscopy.  If you are having any troubles please contact us. 3. Start Movantik 25 mg 1 hour before a meal or 2 hours after a meal for constipation.  Samples provided today.  Prescription sent to your pharmacy. 4. We will request copy of your labs for review

## 2017-07-30 NOTE — Progress Notes (Signed)
CC'ED TO PCP 

## 2017-07-30 NOTE — Assessment & Plan Note (Signed)
61 y/o male with constipation likely opioid induced who presents for further evaluation of heme + stool. Reports last TCS at age 64. Recommend colonoscopy in near future. Deep sedation in OR given polypharmacy.  I have discussed the risks, alternatives, benefits with regards to but not limited to the risk of reaction to medication, bleeding, infection, perforation and the patient is agreeable to proceed. Written consent to be obtained.  We discussed need to manage constipation especially prior to bowel preparation for colonoscopy. Patient voiced understanding. Start movantik 25mg  daily. Samples and RX provided.   Patient had concerns about recent labs and leukocytosis. Request for review.

## 2017-07-31 ENCOUNTER — Telehealth: Payer: Self-pay

## 2017-07-31 ENCOUNTER — Other Ambulatory Visit: Payer: Self-pay

## 2017-07-31 DIAGNOSIS — R195 Other fecal abnormalities: Secondary | ICD-10-CM

## 2017-07-31 NOTE — Telephone Encounter (Signed)
Tried to call pt to inform of pre-op appt 09/12/17 at 12:45pm, no answer, LMOVM. Letter mailed.

## 2017-08-12 ENCOUNTER — Telehealth: Payer: Self-pay | Admitting: *Deleted

## 2017-08-12 ENCOUNTER — Encounter: Payer: Self-pay | Admitting: *Deleted

## 2017-08-12 NOTE — Telephone Encounter (Signed)
Spoke with patient and TCS is r/s'd to 09/19/17 at 9:30am. New instructions have been mailed to patient.

## 2017-08-13 NOTE — Telephone Encounter (Signed)
Spoke with Hoyle Sauer and preop appt scheduled for 09/12/17 at 12:45pm will stay the same, no need to change this. Called patient and LMOVM making aware of this.

## 2017-09-11 NOTE — Patient Instructions (Signed)
   Your procedure is scheduled on: 09/19/2017  Report to [facility] at  7:30   AM.  Call this number if you have problems the morning of surgery: [phone]   Remember:              Follow Directions on the letter you received from Your Physician's office regarding the Bowel Prep  :  Take these medicines the morning of surgery with A SIP OF WATER: lisiopril   Do not wear jewelry, make-up or nail polish.    Do not bring valuables to the hospital.  Contacts, dentures or bridgework may not be worn into surgery.  .   Patients discharged the day of surgery will not be allowed to drive home.     Colonoscopy, Adult, Care After This sheet gives you information about how to care for yourself after your procedure. Your health care provider may also give you more specific instructions. If you have problems or questions, contact your health care provider. What can I expect after the procedure? After the procedure, it is common to have:  A small amount of blood in your stool for 24 hours after the procedure.  Some gas.  Mild abdominal cramping or bloating.  Follow these instructions at home: General instructions   For the first 24 hours after the procedure: ? Do not drive or use machinery. ? Do not sign important documents. ? Do not drink alcohol. ? Do your regular daily activities at a slower pace than normal. ? Eat soft, easy-to-digest foods. ? Rest often.  Take over-the-counter or prescription medicines only as told by your health care provider.  It is up to you to get the results of your procedure. Ask your health care provider, or the department performing the procedure, when your results will be ready. Relieving cramping and bloating  Try walking around when you have cramps or feel bloated.  Apply heat to your abdomen as told by your health care provider. Use a heat source that your health care provider recommends, such as a moist heat pack or a heating pad. ? Place a  towel between your skin and the heat source. ? Leave the heat on for 20-30 minutes. ? Remove the heat if your skin turns bright red. This is especially important if you are unable to feel pain, heat, or cold. You may have a greater risk of getting burned. Eating and drinking  Drink enough fluid to keep your urine clear or pale yellow.  Resume your normal diet as instructed by your health care provider. Avoid heavy or fried foods that are hard to digest.  Avoid drinking alcohol for as long as instructed by your health care provider. Contact a health care provider if:  You have blood in your stool 2-3 days after the procedure. Get help right away if:  You have more than a small spotting of blood in your stool.  You pass large blood clots in your stool.  Your abdomen is swollen.  You have nausea or vomiting.  You have a fever.  You have increasing abdominal pain that is not relieved with medicine. This information is not intended to replace advice given to you by your health care provider. Make sure you discuss any questions you have with your health care provider. Document Released: 04/23/2004 Document Revised: 06/03/2016 Document Reviewed: 11/21/2015 Elsevier Interactive Patient Education  [employer].

## 2017-09-12 ENCOUNTER — Other Ambulatory Visit (HOSPITAL_COMMUNITY): Payer: Self-pay | Admitting: Family Medicine

## 2017-09-12 ENCOUNTER — Encounter (HOSPITAL_COMMUNITY)
Admission: RE | Admit: 2017-09-12 | Discharge: 2017-09-12 | Disposition: A | Discharge status: Home / Self Care | Payer: Medicare Other | Source: Ambulatory Visit | Attending: Internal Medicine | Admitting: Internal Medicine

## 2017-09-12 ENCOUNTER — Encounter (HOSPITAL_COMMUNITY): Payer: Self-pay

## 2017-09-12 ENCOUNTER — Other Ambulatory Visit: Payer: Self-pay

## 2017-09-12 ENCOUNTER — Ambulatory Visit (HOSPITAL_COMMUNITY)
Admission: RE | Admit: 2017-09-12 | Discharge: 2017-09-12 | Disposition: A | Discharge status: Home / Self Care | Payer: Medicare Other | Source: Ambulatory Visit | Attending: Internal Medicine | Admitting: Internal Medicine

## 2017-09-12 ENCOUNTER — Other Ambulatory Visit (HOSPITAL_COMMUNITY): Payer: Self-pay | Admitting: *Deleted

## 2017-09-12 DIAGNOSIS — Z01818 Encounter for other preprocedural examination: Secondary | ICD-10-CM | POA: Diagnosis present

## 2017-09-12 DIAGNOSIS — M5137 Other intervertebral disc degeneration, lumbosacral region: Secondary | ICD-10-CM

## 2017-09-12 DIAGNOSIS — Z01812 Encounter for preprocedural laboratory examination: Secondary | ICD-10-CM | POA: Diagnosis present

## 2017-09-12 DIAGNOSIS — Z01810 Encounter for preprocedural cardiovascular examination: Secondary | ICD-10-CM | POA: Insufficient documentation

## 2017-09-12 HISTORY — DX: Other chronic pain: G89.29

## 2017-09-12 HISTORY — DX: Dorsalgia, unspecified: M54.9

## 2017-09-12 HISTORY — DX: Other cervical disc degeneration, unspecified cervical region: M50.30

## 2017-09-12 LAB — CBC
HCT: 51.4 % (ref 39.0–52.0)
Hemoglobin: 16.6 g/dL (ref 13.0–17.0)
MCH: 30 pg (ref 26.0–34.0)
MCHC: 32.3 g/dL (ref 30.0–36.0)
MCV: 92.9 fL (ref 78.0–100.0)
Platelets: 335 K/uL (ref 150–400)
RBC: 5.53 MIL/uL (ref 4.22–5.81)
RDW: 14.6 % (ref 11.5–15.5)
WBC: 15.9 K/uL — ABNORMAL HIGH (ref 4.0–10.5)

## 2017-09-12 LAB — BASIC METABOLIC PANEL
ANION GAP: 13 (ref 5–15)
BUN: 11 mg/dL (ref 6–20)
CHLORIDE: 99 mmol/L — AB (ref 101–111)
CO2: 22 mmol/L (ref 22–32)
Calcium: 9.3 mg/dL (ref 8.9–10.3)
Creatinine, Ser: 0.66 mg/dL (ref 0.61–1.24)
Glucose, Bld: 122 mg/dL — ABNORMAL HIGH (ref 65–99)
POTASSIUM: 4.6 mmol/L (ref 3.5–5.1)
SODIUM: 134 mmol/L — AB (ref 135–145)

## 2017-09-19 ENCOUNTER — Encounter (HOSPITAL_COMMUNITY): Payer: Self-pay | Admitting: *Deleted

## 2017-09-19 ENCOUNTER — Ambulatory Visit (HOSPITAL_COMMUNITY): Payer: Medicare Other | Admitting: Anesthesiology

## 2017-09-19 ENCOUNTER — Encounter (HOSPITAL_COMMUNITY)
Admission: RE | Disposition: A | Discharge status: Home / Self Care | Payer: Self-pay | Source: Ambulatory Visit | Attending: Internal Medicine

## 2017-09-19 ENCOUNTER — Ambulatory Visit (HOSPITAL_COMMUNITY)
Admission: RE | Admit: 2017-09-19 | Discharge: 2017-09-19 | Disposition: A | Discharge status: Home / Self Care | Payer: Medicare Other | Source: Ambulatory Visit | Attending: Internal Medicine | Admitting: Internal Medicine

## 2017-09-19 DIAGNOSIS — K5730 Diverticulosis of large intestine without perforation or abscess without bleeding: Secondary | ICD-10-CM | POA: Diagnosis not present

## 2017-09-19 DIAGNOSIS — D123 Benign neoplasm of transverse colon: Secondary | ICD-10-CM | POA: Diagnosis not present

## 2017-09-19 DIAGNOSIS — E1140 Type 2 diabetes mellitus with diabetic neuropathy, unspecified: Secondary | ICD-10-CM | POA: Insufficient documentation

## 2017-09-19 DIAGNOSIS — G8929 Other chronic pain: Secondary | ICD-10-CM | POA: Diagnosis not present

## 2017-09-19 DIAGNOSIS — K5909 Other constipation: Secondary | ICD-10-CM | POA: Insufficient documentation

## 2017-09-19 DIAGNOSIS — K921 Melena: Secondary | ICD-10-CM | POA: Diagnosis not present

## 2017-09-19 DIAGNOSIS — Z79899 Other long term (current) drug therapy: Secondary | ICD-10-CM | POA: Insufficient documentation

## 2017-09-19 DIAGNOSIS — M549 Dorsalgia, unspecified: Secondary | ICD-10-CM | POA: Insufficient documentation

## 2017-09-19 DIAGNOSIS — Z9842 Cataract extraction status, left eye: Secondary | ICD-10-CM | POA: Diagnosis not present

## 2017-09-19 DIAGNOSIS — I10 Essential (primary) hypertension: Secondary | ICD-10-CM | POA: Diagnosis not present

## 2017-09-19 DIAGNOSIS — F419 Anxiety disorder, unspecified: Secondary | ICD-10-CM | POA: Insufficient documentation

## 2017-09-19 DIAGNOSIS — T402X5A Adverse effect of other opioids, initial encounter: Secondary | ICD-10-CM | POA: Insufficient documentation

## 2017-09-19 DIAGNOSIS — Z9841 Cataract extraction status, right eye: Secondary | ICD-10-CM | POA: Diagnosis not present

## 2017-09-19 DIAGNOSIS — Z7982 Long term (current) use of aspirin: Secondary | ICD-10-CM | POA: Diagnosis not present

## 2017-09-19 DIAGNOSIS — X58XXXA Exposure to other specified factors, initial encounter: Secondary | ICD-10-CM | POA: Diagnosis not present

## 2017-09-19 DIAGNOSIS — M1990 Unspecified osteoarthritis, unspecified site: Secondary | ICD-10-CM | POA: Insufficient documentation

## 2017-09-19 DIAGNOSIS — D127 Benign neoplasm of rectosigmoid junction: Secondary | ICD-10-CM | POA: Insufficient documentation

## 2017-09-19 DIAGNOSIS — F17210 Nicotine dependence, cigarettes, uncomplicated: Secondary | ICD-10-CM | POA: Insufficient documentation

## 2017-09-19 DIAGNOSIS — E785 Hyperlipidemia, unspecified: Secondary | ICD-10-CM | POA: Diagnosis not present

## 2017-09-19 DIAGNOSIS — R195 Other fecal abnormalities: Secondary | ICD-10-CM

## 2017-09-19 HISTORY — PX: COLONOSCOPY WITH PROPOFOL: SHX5780

## 2017-09-19 HISTORY — PX: POLYPECTOMY: SHX5525

## 2017-09-19 LAB — GLUCOSE, CAPILLARY: Glucose-Capillary: 137 mg/dL — ABNORMAL HIGH (ref 65–99)

## 2017-09-19 SURGERY — COLONOSCOPY WITH PROPOFOL
Anesthesia: Monitor Anesthesia Care

## 2017-09-19 MED ORDER — CHLORHEXIDINE GLUCONATE CLOTH 2 % EX PADS: 6.0000 | MEDICATED_PAD | Freq: Once | CUTANEOUS | Status: DC

## 2017-09-19 MED ORDER — FENTANYL CITRATE (PF) 100 MCG/2ML IJ SOLN
25.0000 ug | Freq: Once | INTRAMUSCULAR | Status: AC
  Administered 2017-09-19: 25 ug via INTRAVENOUS

## 2017-09-19 MED ORDER — PROPOFOL 10 MG/ML IV BOLUS
INTRAVENOUS | Status: AC
  Filled 2017-09-19: qty 40

## 2017-09-19 MED ORDER — PROPOFOL 10 MG/ML IV BOLUS
INTRAVENOUS | Status: DC | PRN
  Administered 2017-09-19 (×2): 20 mg via INTRAVENOUS
  Administered 2017-09-19: 10 mg via INTRAVENOUS

## 2017-09-19 MED ORDER — MIDAZOLAM HCL 2 MG/2ML IJ SOLN
1.0000 mg | INTRAMUSCULAR | Status: AC
  Administered 2017-09-19: 2 mg via INTRAVENOUS
  Filled 2017-09-19: qty 2

## 2017-09-19 MED ORDER — FENTANYL CITRATE (PF) 100 MCG/2ML IJ SOLN
INTRAMUSCULAR | Status: AC
  Filled 2017-09-19: qty 2

## 2017-09-19 MED ORDER — LACTATED RINGERS IV SOLN
INTRAVENOUS | Status: DC
  Administered 2017-09-19: 09:00:00 via INTRAVENOUS

## 2017-09-19 MED ORDER — PROPOFOL 500 MG/50ML IV EMUL
INTRAVENOUS | Status: DC | PRN
  Administered 2017-09-19: 10:00:00 via INTRAVENOUS
  Administered 2017-09-19: 200 ug/kg/min via INTRAVENOUS

## 2017-09-19 NOTE — Discharge Instructions (Signed)
Colon Polyps Polyps are tissue growths inside the body. Polyps can grow in many places, including the large intestine (colon). A polyp may be a round bump or a mushroom-shaped growth. You could have one polyp or several. Most colon polyps are noncancerous (benign). However, some colon polyps can become cancerous over time. What are the causes? The exact cause of colon polyps is not known. What increases the risk? This condition is more likely to develop in people who:  Have a family history of colon cancer or colon polyps.  Are older than 57 or older than 45 if they are African American.  Have inflammatory bowel disease, such as ulcerative colitis or Crohn disease.  Are overweight.  Smoke cigarettes.  Do not get enough exercise.  Drink too much alcohol.  Eat a diet that is: ? High in fat and red meat. ? Low in fiber.  Had childhood cancer that was treated with abdominal radiation.  What are the signs or symptoms? Most polyps do not cause symptoms. If you have symptoms, they may include:  Blood coming from your rectum when having a bowel movement.  Blood in your stool.The stool may look dark red or black.  A change in bowel habits, such as constipation or diarrhea.  How is this diagnosed? This condition is diagnosed with a colonoscopy. This is a procedure that uses a lighted, flexible scope to look at the inside of your colon. How is this treated? Treatment for this condition involves removing any polyps that are found. Those polyps will then be tested for cancer. If cancer is found, your health care provider will talk to you about options for colon cancer treatment. Follow these instructions at home: Diet  Eat plenty of fiber, such as fruits, vegetables, and whole grains.  Eat foods that are high in calcium and vitamin D, such as milk, cheese, yogurt, eggs, liver, fish, and broccoli.  Limit foods high in fat, red meats, and processed meats, such as hot dogs, sausage,  bacon, and lunch meats.  Maintain a healthy weight, or lose weight if recommended by your health care provider. General instructions  Do not smoke cigarettes.  Do not drink alcohol excessively.  Keep all follow-up visits as told by your health care provider. This is important. This includes keeping regularly scheduled colonoscopies. Talk to your health care provider about when you need a colonoscopy.  Exercise every day or as told by your health care provider. Contact a health care provider if:  You have new or worsening bleeding during a bowel movement.  You have new or increased blood in your stool.  You have a change in bowel habits.  You unexpectedly lose weight. This information is not intended to replace advice given to you by your health care provider. Make sure you discuss any questions you have with your health care provider. Document Released: 06/05/2004 Document Revised: 02/15/2016 Document Reviewed: 07/31/2015 Elsevier Interactive Patient Education  [employer]. Diverticulosis Diverticulosis is a condition that develops when small pouches (diverticula) form in the wall of the large intestine (colon). The colon is where water is absorbed and stool is formed. The pouches form when the inside layer of the colon pushes through weak spots in the outer layers of the colon. You may have a few pouches or many of them. What are the causes? The cause of this condition is not known. What increases the risk? The following factors may make you more likely to develop this condition:  Being older than age  49. Your risk for this condition increases with age. Diverticulosis is rare among people younger than age 73. By age 76, many people have it.  Eating a low-fiber diet.  Having frequent constipation.  Being overweight.  Not getting enough exercise.  Smoking.  Taking over-the-counter pain medicines, like aspirin and ibuprofen.  Having a family history of  diverticulosis.  What are the signs or symptoms? In most people, there are no symptoms of this condition. If you do have symptoms, they may include:  Bloating.  Cramps in the abdomen.  Constipation or diarrhea.  Pain in the lower left side of the abdomen.  How is this diagnosed? This condition is most often diagnosed during an exam for other colon problems. Because diverticulosis usually has no symptoms, it often cannot be diagnosed independently. This condition may be diagnosed by:  Using a flexible scope to examine the colon (colonoscopy).  Taking an X-ray of the colon after dye has been put into the colon (barium enema).  Doing a CT scan.  How is this treated? You may not need treatment for this condition if you have never developed an infection related to diverticulosis. If you have had an infection before, treatment may include:  Eating a high-fiber diet. This may include eating more fruits, vegetables, and grains.  Taking a fiber supplement.  Taking a live bacteria supplement (probiotic).  Taking medicine to relax your colon.  Taking antibiotic medicines.  Follow these instructions at home:  Drink 6-8 glasses of water or more each day to prevent constipation.  Try not to strain when you have a bowel movement.  If you have had an infection before: ? Eat more fiber as directed by your health care provider or your diet and nutrition specialist (dietitian). ? Take a fiber supplement or probiotic, if your health care provider approves.  Take over-the-counter and prescription medicines only as told by your health care provider.  If you were prescribed an antibiotic, take it as told by your health care provider. Do not stop taking the antibiotic even if you start to feel better.  Keep all follow-up visits as told by your health care provider. This is important. Contact a health care provider if:  You have pain in your abdomen.  You have bloating.  You have  cramps.  You have not had a bowel movement in 3 days. Get help right away if:  Your pain gets worse.  Your bloating becomes very bad.  You have a fever or chills, and your symptoms suddenly get worse.  You vomit.  You have bowel movements that are bloody or black.  You have bleeding from your rectum. Summary  Diverticulosis is a condition that develops when small pouches (diverticula) form in the wall of the large intestine (colon).  You may have a few pouches or many of them.  This condition is most often diagnosed during an exam for other colon problems.  If you have had an infection related to diverticulosis, treatment may include increasing the fiber in your diet, taking supplements, or taking medicines. This information is not intended to replace advice given to you by your health care provider. Make sure you discuss any questions you have with your health care provider. Document Released: 06/06/2004 Document Revised: 07/29/2016 Document Reviewed: 07/29/2016 Elsevier Interactive Patient Education  2017 [place].  Colonoscopy Discharge Instructions  Read the instructions outlined below and refer to this sheet in the next few weeks. These discharge instructions provide you with general information on caring  for yourself after you leave the hospital. Your doctor may also give you specific instructions. While your treatment has been planned according to the most current medical practices available, unavoidable complications occasionally occur. If you have any problems or questions after discharge, call Dr. Gala Romney at [phone]. ACTIVITY  You may resume your regular activity, but move at a slower pace for the next 24 hours.   Take frequent rest periods for the next 24 hours.   Walking will help get rid of the air and reduce the bloated feeling in your belly (abdomen).   No driving for 24 hours (because of the medicine (anesthesia) used during the test).    Do not sign any  important legal documents or operate any machinery for 24 hours (because of the anesthesia used during the test).  NUTRITION  Drink plenty of fluids.   You may resume your normal diet as instructed by your doctor.   Begin with a light meal and progress to your normal diet. Heavy or fried foods are harder to digest and may make you feel sick to your stomach (nauseated).   Avoid alcoholic beverages for 24 hours or as instructed.  MEDICATIONS  You may resume your normal medications unless your doctor tells you otherwise.  WHAT YOU CAN EXPECT TODAY  Some feelings of bloating in the abdomen.   Passage of more gas than usual.   Spotting of blood in your stool or on the toilet paper.  IF YOU HAD POLYPS REMOVED DURING THE COLONOSCOPY:  No aspirin products for 7 days or as instructed.   No alcohol for 7 days or as instructed.   Eat a soft diet for the next 24 hours.  FINDING OUT THE RESULTS OF YOUR TEST Not all test results are available during your visit. If your test results are not back during the visit, make an appointment with your caregiver to find out the results. Do not assume everything is normal if you have not heard from your caregiver or the medical facility. It is important for you to follow up on all of your test results.  SEEK IMMEDIATE MEDICAL ATTENTION IF:  You have more than a spotting of blood in your stool.   Your belly is swollen (abdominal distention).   You are nauseated or vomiting.   You have a temperature over 101.   You have abdominal pain or discomfort that is severe or gets worse throughout the day.    Diverticulosis and colon polyp information provided  Further recommendations to follow pending review of pathology report  Continuue Movantik daily for constipation

## 2017-09-19 NOTE — Transfer of Care (Signed)
Immediate Anesthesia Transfer of Care Note  Patient: Tyrone Cohen  Procedure(s) Performed: COLONOSCOPY WITH PROPOFOL (N/A ) POLYPECTOMY  Patient Location: PACU  Anesthesia Type:MAC  Level of Consciousness: awake and alert   Airway & Oxygen Therapy: Patient Spontanous Breathing  Post-op Assessment: Report given to RN  Post vital signs: Reviewed and stable  Last Vitals:  Vitals:   09/19/17 0925 09/19/17 0930  BP: 114/76   Pulse:    Resp: 18 (!) 22  Temp:    SpO2: 94% 96%    Last Pain:  Vitals:   09/19/17 0821  TempSrc: Oral  PainSc: 0-No pain         Complications: No apparent anesthesia complications

## 2017-09-19 NOTE — Op Note (Signed)
[facility] Patient Name: Tyrone Cohen Procedure Date: 09/19/2017 9:17 AM MRN: [id number] Date of Birth: 1955-12-19 Attending MD: Norvel Richards , MD CSN: [id number] Age: 61 Admit Type: Outpatient Procedure:                Colonoscopy Indications:              Heme positive stool Providers:                Norvel Richards, MD, Otis Peak B. Sharon Seller, RN,                            Nelma Rothman, Technician, Randa Spike, Technician Referring MD:             Fuller Canada. Manuella Ghazi MD, MD Medicines:                Propofol per Anesthesia Complications:            No immediate complications. Estimated Blood Loss:     Estimated blood loss was minimal. Procedure:                Pre-Anesthesia Assessment:                           - Prior to the procedure, a History and Physical                            was performed, and patient medications and                            allergies were reviewed. The patient's tolerance of                            previous anesthesia was also reviewed. The risks                            and benefits of the procedure and the sedation                            options and risks were discussed with the patient.                            All questions were answered, and informed consent                            was obtained. Prior Anticoagulants: The patient has                            taken no previous anticoagulant or antiplatelet                            agents. ASA Grade Assessment: III - A patient with                            severe systemic disease. After reviewing the risks  and benefits, the patient was deemed in                            satisfactory condition to undergo the procedure.                           After obtaining informed consent, the colonoscope                            was passed under direct vision. Throughout the                            procedure, the patient's blood pressure, pulse,  and                            oxygen saturations were monitored continuously. The                            EC-[id number] ([id number]) scope was introduced through                            the and advanced to the the cecum, identified by                            appendiceal orifice and ileocecal valve. The                            ileocecal valve, appendiceal orifice, and rectum                            were photographed. The entire colon was well                            visualized. The quality of the bowel preparation                            was adequate. Scope In: 9:41:02 AM Scope Out: 10:03:28 AM Scope Withdrawal Time: 0 hours 15 minutes 54 seconds  Total Procedure Duration: 0 hours 22 minutes 26 seconds  Findings:      The perianal and digital rectal examinations were normal.      Two sessile polyps were found in the recto-sigmoid colon and splenic       flexure. The polyps were 4 to 6 mm in size.      Scattered small and large-mouthed diverticula were found in the sigmoid       colon and descending colon.      No additional abnormalities were found on retroflexion. These polyps       were removed with a cold snare. Resection and retrieval were complete.       Estimated blood loss was minimal. Impression:               - Two 4 to 6 mm polyps at the recto-sigmoid colon  and at the splenic flexure, removed with a cold                            snare. Resected and retrieved.                           - Diverticulosis in the sigmoid colon and in the                            descending colon. Moderate Sedation:      Moderate (conscious) sedation was personally administered by an       anesthesia professional. The following parameters were monitored: oxygen       saturation, heart rate, blood pressure, respiratory rate, EKG, adequacy       of pulmonary ventilation, and response to care. Total physician       intraservice time was 22  minutes. Recommendation:           - Patient has a contact number available for                            emergencies. The signs and symptoms of potential                            delayed complications were discussed with the                            patient. Return to normal activities tomorrow.                            Written discharge instructions were provided to the                            patient.                           - Resume previous diet.                           - Continue present medications.                           - Await pathology results.                           - Repeat colonoscopy date to be determined after                            pending pathology results are reviewed for                            surveillance based on pathology results.                           - Return to GI clinic (date not yet determined). Procedure Code(s):        --- Professional ---  45385, Colonoscopy, flexible; with removal of                            tumor(s), polyp(s), or other lesion(s) by snare                            technique Diagnosis Code(s):        --- Professional ---                           D12.7, Benign neoplasm of rectosigmoid junction                           D12.3, Benign neoplasm of transverse colon (hepatic                            flexure or splenic flexure)                           R19.5, Other fecal abnormalities                           K57.30, Diverticulosis of large intestine without                            perforation or abscess without bleeding CPT copyright 2016 American Medical Association. All rights reserved. The codes documented in this report are preliminary and upon coder review may  be revised to meet current compliance requirements. Cristopher Estimable. Rourk, MD Norvel Richards, MD 09/19/2017 10:11:47 AM This report has been signed electronically. Number of Addenda: 0

## 2017-09-19 NOTE — Anesthesia Preprocedure Evaluation (Signed)
Anesthesia Evaluation  Patient identified by MRN, date of birth, ID band Patient awake    Reviewed: Allergy & Precautions, H&P , NPO status , Patient's Chart, lab work & pertinent test results  Airway Mallampati: I  TM Distance: >3 FB     Dental  (+) Edentulous Upper, Edentulous Lower   Pulmonary Current Smoker,    breath sounds clear to auscultation       Cardiovascular hypertension, Pt. on medications  Rhythm:Regular Rate:Normal     Neuro/Psych    GI/Hepatic neg GERD  ,  Endo/Other  diabetes, Type 2, Oral Hypoglycemic Agents  Renal/GU      Musculoskeletal   Abdominal   Peds  Hematology   Anesthesia Other Findings   Reproductive/Obstetrics                             Anesthesia Physical Anesthesia Plan  ASA: III  Anesthesia Plan: MAC   Post-op Pain Management:    Induction: Intravenous  PONV Risk Score and Plan:   Airway Management Planned: Simple Face Mask  Additional Equipment:   Intra-op Plan:   Post-operative Plan:   Informed Consent: I have reviewed the patients History and Physical, chart, labs and discussed the procedure including the risks, benefits and alternatives for the proposed anesthesia with the patient or authorized representative who has indicated his/her understanding and acceptance.     Plan Discussed with:   Anesthesia Plan Comments:         Anesthesia Quick Evaluation

## 2017-09-19 NOTE — H&P (Signed)
'@LOGO' @   Primary Care Physician:  Monico Blitz, MD Primary Gastroenterologist:  Dr. Gala Romney  Pre-Procedure History & Physical: HPI:  Tyrone Cohen is a 61 y.o. male here for further evaluation of Hemoccult-positive stool. 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  Past Medical History:  Diagnosis Date  . Anxiety   . Arthritis   . Chronic back pain   . Degenerative, intervertebral disc, cervical   . Diabetes mellitus without complication ([place])   . Diabetic neuropathy ([place])   . Hyperlipidemia   . Hypertension     Past Surgical History:  Procedure Laterality Date  . CATARACT EXTRACTION W/PHACO Left 03/14/2014   Procedure: CATARACT EXTRACTION PHACO AND INTRAOCULAR LENS PLACEMENT (IOC);  Surgeon: Tonny Branch, MD;  Location: [facility];  Service: Ophthalmology;  Laterality: Left;  CDE:  7.63  . CATARACT EXTRACTION W/PHACO Right 09/05/2014   Procedure: CATARACT EXTRACTION PHACO AND INTRAOCULAR LENS PLACEMENT (IOC);  Surgeon: Tonny Branch, MD;  Location: [facility];  Service: Ophthalmology;  Laterality: Right;  CDE:10.21    Prior to Admission medications   Medication Sig Start Date End Date Taking? Authorizing Provider  aspirin 325 MG EC tablet Take 325 mg by mouth daily.   Yes [provider]  gabapentin (NEURONTIN) 600 MG tablet Take 600 mg 3 (three) times daily by mouth.    Yes [provider]  INVOKAMET XR [phone] MG TB24 Take 1 tablet 2 (two) times daily by mouth. 07/16/17  Yes [provider]  lidocaine (LIDODERM) 5 % Place 3 patches onto the skin daily. Remove & Discard patch within 12 hours or as directed by MD   Yes [provider]  lisinopril (PRINIVIL,ZESTRIL) 10 MG tablet Take 10 mg by mouth daily.   Yes [provider]  methocarbamol (ROBAXIN) 750 MG tablet Take 750 mg 4 (four) times daily by mouth.    Yes [provider]  Na Sulfate-K Sulfate-Mg Sulf (SUPREP BOWEL PREP KIT) 17.5-3.13-1.6 GM/177ML SOLN Take  1 kit as directed by mouth. 07/30/17  Yes Jolene Guyett, Cristopher Estimable, MD  naloxegol oxalate (MOVANTIK) 25 MG TABS tablet Take 1 tablet (25 mg total) daily by mouth. Take 1 hour before or 2 hours after a meal 07/30/17  Yes Mahala Menghini, PA-C  naproxen sodium (ALEVE) 220 MG tablet Take 660 mg by mouth 2 (two) times daily as needed (for pain or headache).   Yes [provider]  oxyCODONE (ROXICODONE) 15 MG immediate release tablet Take 15 mg by mouth every 6 (six) hours as needed for pain.  07/10/17  Yes [provider]  rosuvastatin (CRESTOR) 20 MG tablet Take 20 mg by mouth daily.  07/05/17  Yes [provider]  TRESIBA FLEXTOUCH 200 UNIT/ML SOPN Inject 16 Units daily into the skin. 05/21/17  Yes [provider]  TRULICITY 1.5 HB/7.1IR SOPN Inject 1.5 mg into the skin every Thursday.  07/08/17  Yes [provider]  promethazine (PHENERGAN) 12.5 MG tablet Take 12.5 mg by mouth every 6 (six) hours as needed for nausea or vomiting.  07/09/17   [provider]    Allergies as of 07/31/2017  . (No Known Allergies)    Family History  Problem Relation Age of Onset  . Lung cancer Mother   . Heart attack Father   . Kidney cancer Sister   . Heart attack Brother   . Leukemia Sister   . Cancer Sister        bone marrow  . Colon cancer Neg Hx  Social History   Socioeconomic History  . Marital status: Married    Spouse name: Not on file  . Number of children: Not on file  . Years of education: Not on file  . Highest education level: Not on file  Social Needs  . Financial resource strain: Not on file  . Food insecurity - worry: Not on file  . Food insecurity - inability: Not on file  . Transportation needs - medical: Not on file  . Transportation needs - non-medical: Not on file  Occupational History  . Not on file  Tobacco Use  . Smoking status: Current Every Day Smoker    Packs/day: 2.00    Years: 40.00    Pack years: 80.00    Types:  Cigarettes  . Smokeless tobacco: Never Used  Substance and Sexual Activity  . Alcohol use: Yes    Comment: rare  . Drug use: No  . Sexual activity: Yes    Birth control/protection: None  Other Topics Concern  . Not on file  Social History Narrative  . Not on file    Review of Systems: See HPI, otherwise negative ROS  Physical Exam: BP 120/77   Pulse 98   Temp 97.7 F (36.5 C) (Oral)   SpO2 98%  General:   Alert,  Well-developed, well-nourished, pleasant and cooperative in NAD Neck:  Supple; no masses or thyromegaly. No significant cervical adenopathy. Lungs:  Clear throughout to auscultation.   No wheezes, crackles, or rhonchi. No acute distress. Heart:  Regular rate and rhythm; no murmurs, clicks, rubs,  or gallops. Abdomen: Non-distended, normal bowel sounds.  Soft and nontender without appreciable mass or hepatosplenomegaly.  Pulses:  Normal pulses noted. Extremities:  Without clubbing or edema.  Impression:   Pleasant 62 year old gentleman Hemoccult-positive stool here for a diagnostic colonoscopy. Opioid-induced constipation much improved on daily Movantik.  Recommendations:  I have offered the patient a diagnostic colonoscopy today with propofol.  The risks, benefits, limitations, alternatives and imponderables have been reviewed with the patient. Questions have been answered. All parties are agreeable.  Notice: This dictation was prepared with Dragon dictation along with smaller phrase technology. Any transcriptional errors that result from this process are unintentional and may not be corrected upon review.

## 2017-09-22 ENCOUNTER — Encounter (HOSPITAL_COMMUNITY): Payer: Self-pay | Admitting: Internal Medicine

## 2017-09-22 NOTE — Anesthesia Postprocedure Evaluation (Signed)
Anesthesia Post Note  Patient: Tyrone Cohen  Procedure(s) Performed: COLONOSCOPY WITH PROPOFOL (N/A ) POLYPECTOMY  Patient location during evaluation: PACU Anesthesia Type: MAC Level of consciousness: awake and alert and oriented Pain management: pain level controlled Vital Signs Assessment: post-procedure vital signs reviewed and stable Respiratory status: spontaneous breathing Cardiovascular status: blood pressure returned to baseline Postop Assessment: no apparent nausea or vomiting Anesthetic complications: no Comments: Late entry     Last Vitals:  Vitals:   09/19/17 1023 09/19/17 1030  BP: 112/75 104/64  Pulse: 80 84  Resp: 14 20  Temp:  36.4 C  SpO2: 92% 99%    Last Pain:  Vitals:   09/19/17 1030  TempSrc: Oral  PainSc:                  [name]

## 2017-09-25 ENCOUNTER — Encounter: Payer: Self-pay | Admitting: Internal Medicine

## 2018-03-11 ENCOUNTER — Emergency Department (HOSPITAL_COMMUNITY)
Admission: EM | Admit: 2018-03-11 | Discharge: 2018-03-11 | Disposition: A | Discharge status: Home / Self Care | Payer: Medicare Other | Attending: Emergency Medicine | Admitting: Emergency Medicine

## 2018-03-11 ENCOUNTER — Other Ambulatory Visit: Payer: Self-pay

## 2018-03-11 ENCOUNTER — Encounter (HOSPITAL_COMMUNITY): Payer: Self-pay | Admitting: Emergency Medicine

## 2018-03-11 DIAGNOSIS — F17210 Nicotine dependence, cigarettes, uncomplicated: Secondary | ICD-10-CM | POA: Diagnosis not present

## 2018-03-11 DIAGNOSIS — Z79899 Other long term (current) drug therapy: Secondary | ICD-10-CM | POA: Diagnosis not present

## 2018-03-11 DIAGNOSIS — Z7982 Long term (current) use of aspirin: Secondary | ICD-10-CM | POA: Insufficient documentation

## 2018-03-11 DIAGNOSIS — E119 Type 2 diabetes mellitus without complications: Secondary | ICD-10-CM | POA: Insufficient documentation

## 2018-03-11 DIAGNOSIS — I10 Essential (primary) hypertension: Secondary | ICD-10-CM | POA: Diagnosis not present

## 2018-03-11 DIAGNOSIS — R2241 Localized swelling, mass and lump, right lower limb: Secondary | ICD-10-CM | POA: Insufficient documentation

## 2018-03-11 DIAGNOSIS — M7989 Other specified soft tissue disorders: Secondary | ICD-10-CM

## 2018-03-11 LAB — COMPREHENSIVE METABOLIC PANEL
ALBUMIN: 4 g/dL (ref 3.5–5.0)
ALK PHOS: 61 U/L (ref 38–126)
ALT: 9 U/L — ABNORMAL LOW (ref 17–63)
ANION GAP: 10 (ref 5–15)
AST: 15 U/L (ref 15–41)
BUN: 17 mg/dL (ref 6–20)
CALCIUM: 8.7 mg/dL — AB (ref 8.9–10.3)
CHLORIDE: 97 mmol/L — AB (ref 101–111)
CO2: 25 mmol/L (ref 22–32)
Creatinine, Ser: 0.68 mg/dL (ref 0.61–1.24)
GFR calc non Af Amer: >60 mL/min (ref >60)
GLUCOSE: 241 mg/dL — AB (ref 65–99)
POTASSIUM: 4.1 mmol/L (ref 3.5–5.1)
SODIUM: 132 mmol/L — AB (ref 135–145)
Total Bilirubin: 0.4 mg/dL (ref 0.3–1.2)
Total Protein: 7.3 g/dL (ref 6.5–8.1)

## 2018-03-11 LAB — CBC
HEMATOCRIT: 48.5 % (ref 39.0–52.0)
HEMOGLOBIN: 16.2 g/dL (ref 13.0–17.0)
MCH: 30.9 pg (ref 26.0–34.0)
MCHC: 33.4 g/dL (ref 30.0–36.0)
MCV: 92.4 fL (ref 78.0–100.0)
Platelets: 302 10*3/uL (ref 150–400)
RBC: 5.25 MIL/uL (ref 4.22–5.81)
RDW: 14.2 % (ref 11.5–15.5)
WBC: 14.4 10*3/uL — ABNORMAL HIGH (ref 4.0–10.5)

## 2018-03-11 MED ORDER — RIVAROXABAN 15 MG PO TABS
15.0000 mg | ORAL_TABLET | Freq: Once | ORAL | Status: AC
  Administered 2018-03-11: 15 mg via ORAL
  Filled 2018-03-11 (×2): qty 1

## 2018-03-11 NOTE — ED Provider Notes (Signed)
[facility] EMERGENCY DEPARTMENT Provider Note   CSN: [id number] Arrival date & time: 03/11/18  1943     History   Chief Complaint Chief Complaint  Patient presents with  . Joint Swelling    HPI Tyrone Cohen is a 62 y.o. male.  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  He has had swelling of his right ankle and foot for a day and a half.  No known trauma.  The leg is sore, but not painful.  He has chronic neuropathy and low back pain.  He denies fever, chills, nausea, vomiting, chest pain, shortness of breath, weakness or dizziness.  There are no other no modifying factors.  HPI  Past Medical History:  Diagnosis Date  . Anxiety   . Arthritis   . Chronic back pain   . Degenerative, intervertebral disc, cervical   . Diabetes mellitus without complication ([place])   . Diabetic neuropathy ([place])   . Hyperlipidemia   . Hypertension     Patient Active Problem List   Diagnosis Date Noted  . Therapeutic opioid induced constipation 07/30/2017  . Heme positive stool 07/30/2017    Past Surgical History:  Procedure Laterality Date  . CATARACT EXTRACTION W/PHACO Left 03/14/2014   Procedure: CATARACT EXTRACTION PHACO AND INTRAOCULAR LENS PLACEMENT (IOC);  Surgeon: Tonny Branch, MD;  Location: [facility];  Service: Ophthalmology;  Laterality: Left;  CDE:  7.63  . CATARACT EXTRACTION W/PHACO Right 09/05/2014   Procedure: CATARACT EXTRACTION PHACO AND INTRAOCULAR LENS PLACEMENT (IOC);  Surgeon: Tonny Branch, MD;  Location: [facility];  Service: Ophthalmology;  Laterality: Right;  CDE:10.21  . COLONOSCOPY WITH PROPOFOL N/A 09/19/2017   Procedure: COLONOSCOPY WITH PROPOFOL;  Surgeon: Daneil Dolin, MD;  Location: [facility];  Service: Endoscopy;  Laterality: N/A;  8:30am  . POLYPECTOMY  09/19/2017   Procedure: POLYPECTOMY;  Surgeon: Daneil Dolin, MD;  Location: [facility];  Service: Endoscopy;;   colon         Home Medications    Prior to Admission medications   Medication Sig Start Date End Date Taking? Authorizing Provider  aspirin 325 MG EC tablet Take 325 mg by mouth daily.   Yes [provider]  gabapentin (NEURONTIN) 600 MG tablet Take 600 mg 3 (three) times daily by mouth.    Yes [provider]  INVOKAMET XR [phone] MG TB24 Take 1 tablet 2 (two) times daily by mouth. 07/16/17  Yes [provider]  lidocaine (LIDODERM) 5 % Place 3 patches onto the skin daily. Remove & Discard patch within 12 hours or as directed by MD   Yes [provider]  lisinopril (PRINIVIL,ZESTRIL) 10 MG tablet Take 10 mg by mouth daily.   Yes [provider]  methocarbamol (ROBAXIN) 750 MG tablet Take 750 mg 4 (four) times daily by mouth.    Yes [provider]  naloxegol oxalate (MOVANTIK) 25 MG TABS tablet Take 1 tablet (25 mg total) daily by mouth. Take 1 hour before or 2 hours after a meal Patient taking differently: Take 25 mg by mouth daily as needed (for constipation).  07/30/17  Yes Mahala Menghini, PA-C  oxyCODONE (ROXICODONE) 15 MG immediate release tablet Take 15 mg by mouth every 6 (six) hours as needed for pain.  07/10/17  Yes [provider]  promethazine (PHENERGAN) 12.5 MG tablet Take 12.5 mg by mouth every 6 (six)  hours as needed for nausea or vomiting.  07/09/17  Yes [provider]  rosuvastatin (CRESTOR) 20 MG tablet Take 20 mg by mouth daily.  07/05/17  Yes [provider]  TRESIBA FLEXTOUCH 200 UNIT/ML SOPN Inject 16 Units daily into the skin. 05/21/17  Yes [provider]  TRULICITY 1.5 SF/6.8LE SOPN Inject 1.5 mg into the skin every Thursday.  07/08/17  Yes [provider]  varenicline (CHANTIX) 1 MG tablet Take 1 mg by mouth 2 (two) times daily. 10/27/17  Yes [provider]    Family History Family History  Problem Relation Age of Onset  . Lung cancer Mother   . Heart  attack Father   . Kidney cancer Sister   . Heart attack Brother   . Leukemia Sister   . Cancer Sister        bone marrow  . Colon cancer Neg Hx     Social History Social History   Tobacco Use  . Smoking status: Current Every Day Smoker    Packs/day: 2.00    Years: 40.00    Pack years: 80.00    Types: Cigarettes  . Smokeless tobacco: Never Used  Substance Use Topics  . Alcohol use: Yes    Comment: rare  . Drug use: No     Allergies   Patient has no known allergies.   Review of Systems Review of Systems  All other systems reviewed and are negative.    Physical Exam Updated Vital Signs BP 124/81   Pulse (!) 111   Temp 98.3 F (36.8 C) (Oral)   Resp 15   Wt 102.1 kg (225 lb)   SpO2 96%   BMI 26.68 kg/m   Physical Exam  Constitutional: He is oriented to person, place, and time. He appears well-developed and well-nourished. No distress.  HENT:  Head: Normocephalic and atraumatic.  Right Ear: External ear normal.  Left Ear: External ear normal.  Eyes: Pupils are equal, round, and reactive to light. Conjunctivae and EOM are normal.  Neck: Normal range of motion and phonation normal. Neck supple.  Cardiovascular: Normal rate.  Pulmonary/Chest: Effort normal. He exhibits no bony tenderness.  Musculoskeletal: Normal range of motion.  Right lower leg, swollen from just below knee to toes.  Mild erythema lateral to the ankle, but it does not appear like cellulitis and there is no associated skin wound.  There is no lymphangitis.  He has mild calf tenderness.  There is also tenderness of the popliteal region without palpable mass or cord.  Neurological: He is alert and oriented to person, place, and time. No cranial nerve deficit or sensory deficit. He exhibits normal muscle tone. Coordination normal.  Skin: Skin is warm, dry and intact.  Psychiatric: He has a normal mood and affect. His behavior is normal. Judgment and thought content normal.  Nursing note and vitals  reviewed.    ED Treatments / Results  Labs (all labs ordered are listed, but only abnormal results are displayed) Labs Reviewed  COMPREHENSIVE METABOLIC PANEL - Abnormal; Notable for the following components:      Result Value   Sodium 132 (*)    Chloride 97 (*)    Glucose, Bld 241 (*)    Calcium 8.7 (*)    ALT 9 (*)    All other components within normal limits  CBC - Abnormal; Notable for the following components:   WBC 14.4 (*)    All other components within normal limits    EKG None  Radiology No results found.  Procedures Procedures (including critical care time)  Medications Ordered in ED Medications  Rivaroxaban (XARELTO) tablet 15 mg (15 mg Oral Given 03/11/18 2319)     Initial Impression / Assessment and Plan / ED Course  I have reviewed the triage vital signs and the nursing notes.  Pertinent labs & imaging results that were available during my care of the patient were reviewed by me and considered in my medical decision making (see chart for details).      Patient Vitals for the past 24 hrs:  BP Temp Temp src Pulse Resp SpO2 Weight  03/11/18 2230 124/81 - - (!) 111 15 96 % -  03/11/18 2214 120/82 - - (!) 113 20 95 % -  03/11/18 1948 - - - - - - 102.1 kg (225 lb)  03/11/18 1947 126/79 98.3 F (36.8 C) Oral (!) 113 17 97 % -    At discharge- reevaluation with update and discussion. After initial assessment and treatment, an updated evaluation reveals he is amatory, comfortable and has no complaints.Daleen Bo   Medical Decision Making: Nonspecific right lower leg and foot swelling.  No trauma.  Patient reportedly has a gene that makes it more, to have blood clots.  His clinical evaluation is consistent with DVT.  He was treated with Xarelto, pending ultrasound for a.m.  Doubt PE, pneumonia or fracture.  No good clinical evidence for tendinitis.  No evidence for arterial occlusion.  CRITICAL CARE-no Performed by: Daleen Bo   Nursing Notes  Reviewed/ Care Coordinated Applicable Imaging Reviewed Interpretation of Laboratory Data incorporated into ED treatment  The patient appears reasonably screened and/or stabilized for discharge and I doubt any other medical condition or other [facility] requiring further screening, evaluation, or treatment in the ED at this time prior to discharge.  Plan: Home Medications-usual medicines; Home Treatments-elevate right leg above heart; return here if the recommended treatment, does not improve the symptoms; Recommended follow up-return in a.m. for Doppler imaging and disposition    Final Clinical Impressions(s) / ED Diagnoses   Final diagnoses:  Right leg swelling    ED Discharge Orders        Ordered    US Venous Img Lower Unilateral Right     03/11/18 2244       Daleen Bo, MD 03/12/18 0100

## 2018-03-11 NOTE — ED Triage Notes (Signed)
Pt C/O right ankle swelling and pain. Pt states this started 2 days ago. Pt denies injury.

## 2018-03-11 NOTE — ED Notes (Signed)
Placed patient on 5 lead due to increased heart rate. Patient states that no one has come in to see him. States that if its going to be any longer that he would leave.

## 2018-03-11 NOTE — Discharge Instructions (Addendum)
Return tomorrow morning, for the Doppler ultrasound to evaluate for a blood clot in your right lower leg.

## 2018-03-12 ENCOUNTER — Other Ambulatory Visit (HOSPITAL_COMMUNITY): Payer: Self-pay | Admitting: Emergency Medicine

## 2018-03-12 ENCOUNTER — Ambulatory Visit (HOSPITAL_COMMUNITY)
Admission: RE | Admit: 2018-03-12 | Discharge: 2018-03-12 | Disposition: A | Discharge status: Home / Self Care | Payer: Medicare Other | Source: Ambulatory Visit | Attending: Emergency Medicine | Admitting: Emergency Medicine

## 2018-03-12 DIAGNOSIS — R600 Localized edema: Secondary | ICD-10-CM | POA: Diagnosis not present

## 2018-03-12 DIAGNOSIS — R601 Generalized edema: Secondary | ICD-10-CM

## 2018-03-12 NOTE — ED Provider Notes (Signed)
Doppler study of right lower extremity reveals no DVT.  Right ankle reexamined: Area of erythema and tenderness in the right lateral aspect of the ankle.  Full range of motion.  Will Rx doxycycline 100 mg twice daily and prednisone.  Discussed with the patient and his daughter.  Patient will return if worse.   Nat Christen, MD 03/12/18 1344

## 2018-03-16 ENCOUNTER — Ambulatory Visit (HOSPITAL_COMMUNITY): Payer: Medicare Other

## 2020-02-02 DIAGNOSIS — Z79899 Other long term (current) drug therapy: Secondary | ICD-10-CM | POA: Diagnosis not present

## 2020-02-02 DIAGNOSIS — M5137 Other intervertebral disc degeneration, lumbosacral region: Secondary | ICD-10-CM | POA: Diagnosis not present

## 2020-02-02 DIAGNOSIS — M47812 Spondylosis without myelopathy or radiculopathy, cervical region: Secondary | ICD-10-CM | POA: Diagnosis not present

## 2020-02-02 DIAGNOSIS — M545 Low back pain: Secondary | ICD-10-CM | POA: Diagnosis not present

## 2020-02-20 DIAGNOSIS — E119 Type 2 diabetes mellitus without complications: Secondary | ICD-10-CM | POA: Diagnosis not present

## 2020-03-01 DIAGNOSIS — M5137 Other intervertebral disc degeneration, lumbosacral region: Secondary | ICD-10-CM | POA: Diagnosis not present

## 2020-03-01 DIAGNOSIS — Z79899 Other long term (current) drug therapy: Secondary | ICD-10-CM | POA: Diagnosis not present

## 2020-03-01 DIAGNOSIS — M545 Low back pain: Secondary | ICD-10-CM | POA: Diagnosis not present

## 2020-03-01 DIAGNOSIS — M47812 Spondylosis without myelopathy or radiculopathy, cervical region: Secondary | ICD-10-CM | POA: Diagnosis not present

## 2020-03-22 DIAGNOSIS — E119 Type 2 diabetes mellitus without complications: Secondary | ICD-10-CM | POA: Diagnosis not present

## 2020-03-29 DIAGNOSIS — M545 Low back pain: Secondary | ICD-10-CM | POA: Diagnosis not present

## 2020-03-29 DIAGNOSIS — M47812 Spondylosis without myelopathy or radiculopathy, cervical region: Secondary | ICD-10-CM | POA: Diagnosis not present

## 2020-03-29 DIAGNOSIS — Z79899 Other long term (current) drug therapy: Secondary | ICD-10-CM | POA: Diagnosis not present

## 2020-03-29 DIAGNOSIS — M5137 Other intervertebral disc degeneration, lumbosacral region: Secondary | ICD-10-CM | POA: Diagnosis not present

## 2020-03-30 DIAGNOSIS — Z09 Encounter for follow-up examination after completed treatment for conditions other than malignant neoplasm: Secondary | ICD-10-CM | POA: Diagnosis not present

## 2020-03-31 DIAGNOSIS — Z299 Encounter for prophylactic measures, unspecified: Secondary | ICD-10-CM | POA: Diagnosis not present

## 2020-03-31 DIAGNOSIS — I10 Essential (primary) hypertension: Secondary | ICD-10-CM | POA: Diagnosis not present

## 2020-03-31 DIAGNOSIS — E1142 Type 2 diabetes mellitus with diabetic polyneuropathy: Secondary | ICD-10-CM | POA: Diagnosis not present

## 2020-03-31 DIAGNOSIS — F17210 Nicotine dependence, cigarettes, uncomplicated: Secondary | ICD-10-CM | POA: Diagnosis not present

## 2020-03-31 DIAGNOSIS — E1165 Type 2 diabetes mellitus with hyperglycemia: Secondary | ICD-10-CM | POA: Diagnosis not present

## 2020-04-05 DIAGNOSIS — Z720 Tobacco use: Secondary | ICD-10-CM | POA: Diagnosis not present

## 2020-04-05 DIAGNOSIS — E1142 Type 2 diabetes mellitus with diabetic polyneuropathy: Secondary | ICD-10-CM | POA: Diagnosis not present

## 2020-04-05 DIAGNOSIS — Z09 Encounter for follow-up examination after completed treatment for conditions other than malignant neoplasm: Secondary | ICD-10-CM | POA: Diagnosis not present

## 2020-04-05 DIAGNOSIS — Z794 Long term (current) use of insulin: Secondary | ICD-10-CM | POA: Diagnosis not present

## 2020-04-20 DIAGNOSIS — R110 Nausea: Secondary | ICD-10-CM | POA: Diagnosis not present

## 2020-04-20 DIAGNOSIS — M545 Low back pain: Secondary | ICD-10-CM | POA: Diagnosis not present

## 2020-04-20 DIAGNOSIS — M47812 Spondylosis without myelopathy or radiculopathy, cervical region: Secondary | ICD-10-CM | POA: Diagnosis not present

## 2020-04-20 DIAGNOSIS — M5137 Other intervertebral disc degeneration, lumbosacral region: Secondary | ICD-10-CM | POA: Diagnosis not present

## 2020-04-20 DIAGNOSIS — Z79899 Other long term (current) drug therapy: Secondary | ICD-10-CM | POA: Diagnosis not present

## 2020-04-21 DIAGNOSIS — E119 Type 2 diabetes mellitus without complications: Secondary | ICD-10-CM | POA: Diagnosis not present

## 2020-05-01 DIAGNOSIS — E1142 Type 2 diabetes mellitus with diabetic polyneuropathy: Secondary | ICD-10-CM | POA: Diagnosis not present

## 2020-05-01 DIAGNOSIS — Z299 Encounter for prophylactic measures, unspecified: Secondary | ICD-10-CM | POA: Diagnosis not present

## 2020-05-01 DIAGNOSIS — I10 Essential (primary) hypertension: Secondary | ICD-10-CM | POA: Diagnosis not present

## 2020-05-01 DIAGNOSIS — E1165 Type 2 diabetes mellitus with hyperglycemia: Secondary | ICD-10-CM | POA: Diagnosis not present

## 2020-05-18 DIAGNOSIS — R110 Nausea: Secondary | ICD-10-CM | POA: Diagnosis not present

## 2020-05-18 DIAGNOSIS — M5137 Other intervertebral disc degeneration, lumbosacral region: Secondary | ICD-10-CM | POA: Diagnosis not present

## 2020-05-18 DIAGNOSIS — M47812 Spondylosis without myelopathy or radiculopathy, cervical region: Secondary | ICD-10-CM | POA: Diagnosis not present

## 2020-05-18 DIAGNOSIS — Z79899 Other long term (current) drug therapy: Secondary | ICD-10-CM | POA: Diagnosis not present

## 2020-05-18 DIAGNOSIS — M545 Low back pain: Secondary | ICD-10-CM | POA: Diagnosis not present

## 2020-05-23 DIAGNOSIS — E119 Type 2 diabetes mellitus without complications: Secondary | ICD-10-CM | POA: Diagnosis not present

## 2020-06-14 DIAGNOSIS — M47812 Spondylosis without myelopathy or radiculopathy, cervical region: Secondary | ICD-10-CM | POA: Diagnosis not present

## 2020-06-14 DIAGNOSIS — R110 Nausea: Secondary | ICD-10-CM | POA: Diagnosis not present

## 2020-06-14 DIAGNOSIS — M545 Low back pain: Secondary | ICD-10-CM | POA: Diagnosis not present

## 2020-06-14 DIAGNOSIS — M5137 Other intervertebral disc degeneration, lumbosacral region: Secondary | ICD-10-CM | POA: Diagnosis not present

## 2020-06-14 DIAGNOSIS — Z79899 Other long term (current) drug therapy: Secondary | ICD-10-CM | POA: Diagnosis not present

## 2020-06-22 DIAGNOSIS — E119 Type 2 diabetes mellitus without complications: Secondary | ICD-10-CM | POA: Diagnosis not present

## 2020-06-26 DIAGNOSIS — I10 Essential (primary) hypertension: Secondary | ICD-10-CM | POA: Diagnosis not present

## 2020-06-26 DIAGNOSIS — Z79899 Other long term (current) drug therapy: Secondary | ICD-10-CM | POA: Diagnosis not present

## 2020-06-26 DIAGNOSIS — E7800 Pure hypercholesterolemia, unspecified: Secondary | ICD-10-CM | POA: Diagnosis not present

## 2020-06-26 DIAGNOSIS — Z0000 Encounter for general adult medical examination without abnormal findings: Secondary | ICD-10-CM | POA: Diagnosis not present

## 2020-06-26 DIAGNOSIS — Z299 Encounter for prophylactic measures, unspecified: Secondary | ICD-10-CM | POA: Diagnosis not present

## 2020-06-26 DIAGNOSIS — R5383 Other fatigue: Secondary | ICD-10-CM | POA: Diagnosis not present

## 2020-06-26 DIAGNOSIS — Z7189 Other specified counseling: Secondary | ICD-10-CM | POA: Diagnosis not present

## 2020-07-12 DIAGNOSIS — Z79899 Other long term (current) drug therapy: Secondary | ICD-10-CM | POA: Diagnosis not present

## 2020-07-12 DIAGNOSIS — M545 Low back pain, unspecified: Secondary | ICD-10-CM | POA: Diagnosis not present

## 2020-07-12 DIAGNOSIS — M47812 Spondylosis without myelopathy or radiculopathy, cervical region: Secondary | ICD-10-CM | POA: Diagnosis not present

## 2020-07-12 DIAGNOSIS — M5137 Other intervertebral disc degeneration, lumbosacral region: Secondary | ICD-10-CM | POA: Diagnosis not present

## 2020-07-12 DIAGNOSIS — R110 Nausea: Secondary | ICD-10-CM | POA: Diagnosis not present

## 2020-07-22 DIAGNOSIS — E119 Type 2 diabetes mellitus without complications: Secondary | ICD-10-CM | POA: Diagnosis not present

## 2020-08-01 DIAGNOSIS — Z09 Encounter for follow-up examination after completed treatment for conditions other than malignant neoplasm: Secondary | ICD-10-CM | POA: Diagnosis not present

## 2020-08-04 DIAGNOSIS — E1142 Type 2 diabetes mellitus with diabetic polyneuropathy: Secondary | ICD-10-CM | POA: Diagnosis not present

## 2020-08-04 DIAGNOSIS — F17210 Nicotine dependence, cigarettes, uncomplicated: Secondary | ICD-10-CM | POA: Diagnosis not present

## 2020-08-04 DIAGNOSIS — Z23 Encounter for immunization: Secondary | ICD-10-CM | POA: Diagnosis not present

## 2020-08-04 DIAGNOSIS — E1165 Type 2 diabetes mellitus with hyperglycemia: Secondary | ICD-10-CM | POA: Diagnosis not present

## 2020-08-04 DIAGNOSIS — Z299 Encounter for prophylactic measures, unspecified: Secondary | ICD-10-CM | POA: Diagnosis not present

## 2020-08-04 DIAGNOSIS — I10 Essential (primary) hypertension: Secondary | ICD-10-CM | POA: Diagnosis not present

## 2020-08-07 DIAGNOSIS — M5459 Other low back pain: Secondary | ICD-10-CM | POA: Diagnosis not present

## 2020-08-07 DIAGNOSIS — Z79899 Other long term (current) drug therapy: Secondary | ICD-10-CM | POA: Diagnosis not present

## 2020-08-07 DIAGNOSIS — R110 Nausea: Secondary | ICD-10-CM | POA: Diagnosis not present

## 2020-08-07 DIAGNOSIS — M47812 Spondylosis without myelopathy or radiculopathy, cervical region: Secondary | ICD-10-CM | POA: Diagnosis not present

## 2020-08-07 DIAGNOSIS — M5137 Other intervertebral disc degeneration, lumbosacral region: Secondary | ICD-10-CM | POA: Diagnosis not present

## 2020-08-08 DIAGNOSIS — E1142 Type 2 diabetes mellitus with diabetic polyneuropathy: Secondary | ICD-10-CM | POA: Diagnosis not present

## 2020-08-08 DIAGNOSIS — Z23 Encounter for immunization: Secondary | ICD-10-CM | POA: Diagnosis not present

## 2020-08-08 DIAGNOSIS — Z720 Tobacco use: Secondary | ICD-10-CM | POA: Diagnosis not present

## 2020-08-08 DIAGNOSIS — Z09 Encounter for follow-up examination after completed treatment for conditions other than malignant neoplasm: Secondary | ICD-10-CM | POA: Diagnosis not present

## 2020-08-08 DIAGNOSIS — Z794 Long term (current) use of insulin: Secondary | ICD-10-CM | POA: Diagnosis not present

## 2020-08-22 DIAGNOSIS — Z09 Encounter for follow-up examination after completed treatment for conditions other than malignant neoplasm: Secondary | ICD-10-CM | POA: Diagnosis not present

## 2020-08-22 DIAGNOSIS — E119 Type 2 diabetes mellitus without complications: Secondary | ICD-10-CM | POA: Diagnosis not present

## 2020-09-01 DIAGNOSIS — M47812 Spondylosis without myelopathy or radiculopathy, cervical region: Secondary | ICD-10-CM | POA: Diagnosis not present

## 2020-09-01 DIAGNOSIS — Z79899 Other long term (current) drug therapy: Secondary | ICD-10-CM | POA: Diagnosis not present

## 2020-09-01 DIAGNOSIS — R110 Nausea: Secondary | ICD-10-CM | POA: Diagnosis not present

## 2020-09-01 DIAGNOSIS — M5459 Other low back pain: Secondary | ICD-10-CM | POA: Diagnosis not present

## 2020-09-01 DIAGNOSIS — M5137 Other intervertebral disc degeneration, lumbosacral region: Secondary | ICD-10-CM | POA: Diagnosis not present

## 2020-09-18 DIAGNOSIS — E1165 Type 2 diabetes mellitus with hyperglycemia: Secondary | ICD-10-CM | POA: Diagnosis not present

## 2020-09-18 DIAGNOSIS — E1142 Type 2 diabetes mellitus with diabetic polyneuropathy: Secondary | ICD-10-CM | POA: Diagnosis not present

## 2020-09-18 DIAGNOSIS — I10 Essential (primary) hypertension: Secondary | ICD-10-CM | POA: Diagnosis not present

## 2020-09-18 DIAGNOSIS — Z299 Encounter for prophylactic measures, unspecified: Secondary | ICD-10-CM | POA: Diagnosis not present

## 2020-09-21 DIAGNOSIS — E119 Type 2 diabetes mellitus without complications: Secondary | ICD-10-CM | POA: Diagnosis not present

## 2020-09-27 ENCOUNTER — Ambulatory Visit: Payer: Medicare Other | Admitting: Gastroenterology

## 2020-09-29 DIAGNOSIS — M5137 Other intervertebral disc degeneration, lumbosacral region: Secondary | ICD-10-CM | POA: Diagnosis not present

## 2020-09-29 DIAGNOSIS — M47812 Spondylosis without myelopathy or radiculopathy, cervical region: Secondary | ICD-10-CM | POA: Diagnosis not present

## 2020-09-29 DIAGNOSIS — R110 Nausea: Secondary | ICD-10-CM | POA: Diagnosis not present

## 2020-09-29 DIAGNOSIS — Z79899 Other long term (current) drug therapy: Secondary | ICD-10-CM | POA: Diagnosis not present

## 2020-09-29 DIAGNOSIS — M5459 Other low back pain: Secondary | ICD-10-CM | POA: Diagnosis not present

## 2020-10-23 DIAGNOSIS — E119 Type 2 diabetes mellitus without complications: Secondary | ICD-10-CM | POA: Diagnosis not present

## 2020-10-27 DIAGNOSIS — M47812 Spondylosis without myelopathy or radiculopathy, cervical region: Secondary | ICD-10-CM | POA: Diagnosis not present

## 2020-10-27 DIAGNOSIS — R110 Nausea: Secondary | ICD-10-CM | POA: Diagnosis not present

## 2020-10-27 DIAGNOSIS — M5459 Other low back pain: Secondary | ICD-10-CM | POA: Diagnosis not present

## 2020-10-27 DIAGNOSIS — Z79899 Other long term (current) drug therapy: Secondary | ICD-10-CM | POA: Diagnosis not present

## 2020-10-27 DIAGNOSIS — M5137 Other intervertebral disc degeneration, lumbosacral region: Secondary | ICD-10-CM | POA: Diagnosis not present

## 2020-11-02 DIAGNOSIS — Z09 Encounter for follow-up examination after completed treatment for conditions other than malignant neoplasm: Secondary | ICD-10-CM | POA: Diagnosis not present

## 2020-11-08 DIAGNOSIS — E119 Type 2 diabetes mellitus without complications: Secondary | ICD-10-CM | POA: Diagnosis not present

## 2020-11-08 DIAGNOSIS — Z7189 Other specified counseling: Secondary | ICD-10-CM | POA: Diagnosis not present

## 2020-11-08 DIAGNOSIS — I10 Essential (primary) hypertension: Secondary | ICD-10-CM | POA: Diagnosis not present

## 2020-11-08 DIAGNOSIS — Z09 Encounter for follow-up examination after completed treatment for conditions other than malignant neoplasm: Secondary | ICD-10-CM | POA: Diagnosis not present

## 2020-11-08 DIAGNOSIS — E1142 Type 2 diabetes mellitus with diabetic polyneuropathy: Secondary | ICD-10-CM | POA: Diagnosis not present

## 2020-11-08 DIAGNOSIS — M1990 Unspecified osteoarthritis, unspecified site: Secondary | ICD-10-CM | POA: Diagnosis not present

## 2020-11-08 DIAGNOSIS — Z720 Tobacco use: Secondary | ICD-10-CM | POA: Diagnosis not present

## 2020-11-08 DIAGNOSIS — Z794 Long term (current) use of insulin: Secondary | ICD-10-CM | POA: Diagnosis not present

## 2020-11-08 DIAGNOSIS — F17210 Nicotine dependence, cigarettes, uncomplicated: Secondary | ICD-10-CM | POA: Diagnosis not present

## 2020-11-08 DIAGNOSIS — Z79899 Other long term (current) drug therapy: Secondary | ICD-10-CM | POA: Diagnosis not present

## 2020-11-08 DIAGNOSIS — Z7982 Long term (current) use of aspirin: Secondary | ICD-10-CM | POA: Diagnosis not present

## 2020-11-08 DIAGNOSIS — J449 Chronic obstructive pulmonary disease, unspecified: Secondary | ICD-10-CM | POA: Diagnosis not present

## 2020-11-08 DIAGNOSIS — E785 Hyperlipidemia, unspecified: Secondary | ICD-10-CM | POA: Diagnosis not present

## 2020-11-15 DIAGNOSIS — F17210 Nicotine dependence, cigarettes, uncomplicated: Secondary | ICD-10-CM | POA: Diagnosis not present

## 2020-11-20 DIAGNOSIS — E119 Type 2 diabetes mellitus without complications: Secondary | ICD-10-CM | POA: Diagnosis not present

## 2021-06-26 ENCOUNTER — Other Ambulatory Visit (HOSPITAL_COMMUNITY): Payer: Self-pay | Admitting: Oncology

## 2021-06-26 DIAGNOSIS — N2889 Other specified disorders of kidney and ureter: Secondary | ICD-10-CM

## 2021-06-28 ENCOUNTER — Other Ambulatory Visit: Payer: Self-pay

## 2021-06-28 ENCOUNTER — Encounter (HOSPITAL_COMMUNITY)
Admission: RE | Admit: 2021-06-28 | Discharge: 2021-06-28 | Disposition: A | Discharge status: Home / Self Care | Payer: Medicare Other | Source: Ambulatory Visit | Attending: Oncology | Admitting: Oncology

## 2021-06-28 DIAGNOSIS — N2889 Other specified disorders of kidney and ureter: Secondary | ICD-10-CM | POA: Insufficient documentation

## 2021-06-28 MED ORDER — FLUDEOXYGLUCOSE F - 18 (FDG) INJECTION
13.3600 | Freq: Once | INTRAVENOUS | Status: AC | PRN
  Administered 2021-06-28: 13.36 via INTRAVENOUS

## 2021-07-20 HISTORY — PX: NEPHRECTOMY: SHX65

## 2021-09-03 ENCOUNTER — Encounter: Payer: Self-pay | Admitting: Internal Medicine

## 2021-09-20 ENCOUNTER — Ambulatory Visit: Payer: Medicare Other | Admitting: Internal Medicine

## 2021-09-20 ENCOUNTER — Encounter: Payer: Self-pay | Admitting: Internal Medicine

## 2021-09-20 ENCOUNTER — Other Ambulatory Visit: Payer: Self-pay

## 2021-09-20 VITALS — BP 137/80 | HR 104 | Temp 97.1°F | Ht 76.0 in | Wt 233.6 lb

## 2021-09-20 DIAGNOSIS — Z86010 Personal history of colonic polyps: Secondary | ICD-10-CM | POA: Diagnosis not present

## 2021-09-20 DIAGNOSIS — R195 Other fecal abnormalities: Secondary | ICD-10-CM

## 2021-09-20 DIAGNOSIS — R933 Abnormal findings on diagnostic imaging of other parts of digestive tract: Secondary | ICD-10-CM

## 2021-09-20 NOTE — H&P (View-Only) (Signed)
Primary Care Physician:  Tyrone Blitz, MD Primary Gastroenterologist:  Dr. Gala Cohen  Pre-Procedure History & Physical: HPI:  Tyrone Cohen is a 65 y.o. male here for an referral for further evaluation of an abnormal PET and Hemoccult positive stool.  Patient has history of left renal cell carcinoma status post radical robotic left nephrectomy.  Surveillance PET demonstrated increased uptake in the rectosigmoid junction.  Along the way, he was also found to be occult blood positive.  Mr. Tyrone Cohen has no lower GI tract symptoms he describes his bowel function is being normal having 1-2 formed bowel movements daily is never seen any blood or melena no rectal pain.  Past GI history is significant for undergoing a colonoscopy performed by me back in 2018 revealed 2 small rectosigmoid adenomas-removed and left-sided diverticulosis.  Good prep examination.  He is due for surveillance colonoscopy the first of the year.  In reviewing care everywhere I note that he had a CBC done on 08/30/2021 at [facility] which revealed a hemoglobin hematocrit of 15.6 and 46.0.  There is some question as to whether or not medication use interfered with the PET scan and created a false positive result (metformin, which the patient is taking). Past Medical History:  Diagnosis Date   Anxiety    Arthritis    Chronic back pain    Degenerative, intervertebral disc, cervical    Diabetes mellitus without complication (HCC)    Diabetic neuropathy ([place])    Hyperlipidemia    Hypertension     Past Surgical History:  Procedure Laterality Date   CATARACT EXTRACTION W/PHACO Left 03/14/2014   Procedure: CATARACT EXTRACTION PHACO AND INTRAOCULAR LENS PLACEMENT ([place]);  Surgeon: Tyrone Branch, MD;  Location: [facility];  Service: Ophthalmology;  Laterality: Left;  CDE:  7.63   CATARACT EXTRACTION W/PHACO Right 09/05/2014   Procedure: CATARACT EXTRACTION PHACO AND INTRAOCULAR LENS PLACEMENT ([facility]);  Surgeon: Tyrone Branch, MD;  Location: [facility];   Service: Ophthalmology;  Laterality: Right;  CDE:10.21   COLONOSCOPY WITH PROPOFOL N/A 09/19/2017   Procedure: COLONOSCOPY WITH PROPOFOL;  Surgeon: Tyrone Dolin, MD;  Location: [facility];  Service: Endoscopy;  Laterality: N/A;  8:30am   POLYPECTOMY  09/19/2017   Procedure: POLYPECTOMY;  Surgeon: Tyrone Dolin, MD;  Location: [facility];  Service: Endoscopy;;  colon     Prior to Admission medications   Medication Sig Start Date End Date Taking? Authorizing Provider  aspirin 325 MG EC tablet Take 325 mg by mouth daily.   Yes [provider]  gabapentin (NEURONTIN) 600 MG tablet Take 600 mg 3 (three) times daily by mouth.    Yes [provider]  lidocaine (LIDODERM) 5 % Place 3 patches onto the skin as needed. Remove & Discard patch within 12 hours or as directed by MD   Yes [provider]  lisinopril (PRINIVIL,ZESTRIL) 10 MG tablet Take 10 mg by mouth daily.   Yes [provider]  metFORMIN (GLUCOPHAGE) 1000 MG tablet Take 1,000 mg by mouth 2 (two) times daily. 09/03/21  Yes [provider]  methocarbamol (ROBAXIN) 750 MG tablet Take 750 mg by mouth 4 (four) times daily.   Yes [provider]  oxyCODONE (ROXICODONE) 15 MG immediate release tablet Take 15 mg by mouth. Takes 1 tablet 8 times daily 07/10/17  Yes [provider]  promethazine (PHENERGAN) 12.5 MG tablet Take 12.5 mg by mouth every 6 (six) hours as needed for nausea or vomiting.  07/09/17  Yes [provider]  rosuvastatin (CRESTOR) 20 MG tablet Take 20 mg by mouth daily.  07/05/17  Yes [provider]  TRESIBA FLEXTOUCH 200 UNIT/ML SOPN Inject 32 Units into the skin daily. 05/21/17  Yes [provider]  TRULICITY 1.5 HY/8.5OY SOPN Inject 1.5 mg into the skin every Thursday.  07/08/17  Yes [provider]  varenicline (CHANTIX) 1 MG tablet Take 1 mg by mouth 2 (two) times daily. 10/27/17  Yes [provider]     Allergies as of 09/20/2021   (No Known Allergies)    Family History  Problem Relation Age of Onset   Lung cancer Mother    Heart attack Father    Kidney cancer Sister    Heart attack Brother    Leukemia Sister    Cancer Sister        bone marrow   Colon cancer Neg Hx     Social History   Socioeconomic History   Marital status: Married    Spouse name: Not on file   Number of children: Not on file   Years of education: Not on file   Highest education level: Not on file  Occupational History   Not on file  Tobacco Use   Smoking status: Every Day    Packs/day: 2.00    Years: 40.00    Pack years: 80.00    Types: Cigarettes   Smokeless tobacco: Never  Vaping Use   Vaping Use: Never used  Substance and Sexual Activity   Alcohol use: Yes    Comment: rare   Drug use: No   Sexual activity: Yes    Birth control/protection: None  Other Topics Concern   Not on file  Social History Narrative   Not on file   Social Determinants of Health   Financial Resource Strain: Not on file  Food Insecurity: Not on file  Transportation Needs: Not on file  Physical Activity: Not on file  Stress: Not on file  Social Connections: Not on file  Intimate Partner Violence: Not on file    Review of Systems: See HPI, otherwise negative ROS  Physical Exam: BP 137/80    Pulse (!) 104    Temp (!) 97.1 F (36.2 C) (Temporal)    Ht 6\' 4"  (1.93 m)    Wt 233 lb 9.6 oz (106 kg)    BMI 28.43 kg/m  General:   Alert,   well-nourished, pleasant and cooperative in NAD; accompanied by his wife. Neck:  Supple; no masses or thyromegaly. No significant cervical adenopathy. Lungs:  Clear throughout to auscultation.   No wheezes, crackles, or rhonchi. No acute distress. Heart:  Regular rate and rhythm; no murmurs, clicks, rubs,  or gallops. Abdomen: Non-distended, normal bowel sounds.  Soft and nontender without appreciable mass or hepatosplenomegaly.  Pulses:  Normal pulses noted. Extremities:   Without clubbing or edema.  Impression/Plan: Very pleasant 65 year old gentleman with a personal history of colonic adenoma and diverticulosis on colonoscopy 2018; due for surveillance colonoscopy in the very near future who presents with abnormal PET scan suggesting increased uptake in the rectosigmoid area.  He is reported to be Hemoccult positive.  He is not anemic.  He has no lower GI tract symptoms.  While the abnormal area of the rectosigmoid on PET scanning could be a false positive, further evaluation is warranted.  Moreover, he is Hemoccult positive and has a personal history of colonic polyps.  He needs an updated colonoscopy.  Recommendations:  As discussed, we will schedule  a diagnostic colonoscopy (abnormal CT imaging of colon).  ASA 3/propofol.  The risks, benefits, limitations, alternatives and imponderables have been reviewed with the patient. Questions have been answered. All parties are agreeable.    Further recommendations to follow after colonoscopy has been performed   Notice: This dictation was prepared with Dragon dictation along with smaller phrase technology. Any transcriptional errors that result from this process are unintentional and may not be corrected upon review.

## 2021-09-20 NOTE — Progress Notes (Signed)
Primary Care Physician:  Tyrone Blitz, MD Primary Gastroenterologist:  Dr. Gala Romney  Pre-Procedure History & Physical: HPI:  Tyrone Cohen is a 65 y.o. male here for an referral for further evaluation of an abnormal PET and Hemoccult positive stool.  Patient has history of left renal cell carcinoma status post radical robotic left nephrectomy.  Surveillance PET demonstrated increased uptake in the rectosigmoid junction.  Along the way, he was also found to be occult blood positive.  [name] has no lower GI tract symptoms he describes his bowel function is being normal having 1-2 formed bowel movements daily is never seen any blood or melena no rectal pain.  Past GI history is significant for undergoing a colonoscopy performed by me back in 2018 revealed 2 small rectosigmoid adenomas-removed and left-sided diverticulosis.  Good prep examination.  He is due for surveillance colonoscopy the first of the year.  In reviewing care everywhere I note that he had a CBC done on 08/30/2021 at [facility] which revealed a hemoglobin hematocrit of 15.6 and 46.0.  There is some question as to whether or not medication use interfered with the PET scan and created a false positive result (metformin, which the patient is taking). Past Medical History:  Diagnosis Date   Anxiety    Arthritis    Chronic back pain    Degenerative, intervertebral disc, cervical    Diabetes mellitus without complication (HCC)    Diabetic neuropathy ([place])    Hyperlipidemia    Hypertension     Past Surgical History:  Procedure Laterality Date   CATARACT EXTRACTION W/PHACO Left 03/14/2014   Procedure: CATARACT EXTRACTION PHACO AND INTRAOCULAR LENS PLACEMENT ([place]);  Surgeon: Tonny Branch, MD;  Location: [facility];  Service: Ophthalmology;  Laterality: Left;  CDE:  7.63   CATARACT EXTRACTION W/PHACO Right 09/05/2014   Procedure: CATARACT EXTRACTION PHACO AND INTRAOCULAR LENS PLACEMENT ([facility]);  Surgeon: Tonny Branch, MD;  Location: [facility];   Service: Ophthalmology;  Laterality: Right;  CDE:10.21   COLONOSCOPY WITH PROPOFOL N/A 09/19/2017   Procedure: COLONOSCOPY WITH PROPOFOL;  Surgeon: Daneil Dolin, MD;  [facility];  Service: Endoscopy;  Laterality: N/A;  8:30am   POLYPECTOMY  09/19/2017   Procedure: POLYPECTOMY;  Surgeon: Daneil Dolin, MD;  [facility];  Service: Endoscopy;;  colon     Prior to Admission medications   Medication Sig Start Date End Date Taking? Authorizing Provider  aspirin 325 MG EC tablet Take 325 mg by mouth daily.   Yes [provider]  gabapentin (NEURONTIN) 600 MG tablet Take 600 mg 3 (three) times daily by mouth.    Yes [provider]  lidocaine (LIDODERM) 5 % Place 3 patches onto the skin as needed. Remove & Discard patch within 12 hours or as directed by MD   Yes [provider]  lisinopril (PRINIVIL,ZESTRIL) 10 MG tablet Take 10 mg by mouth daily.   Yes [provider]  metFORMIN (GLUCOPHAGE) 1000 MG tablet Take 1,000 mg by mouth 2 (two) times daily. 09/03/21  Yes [provider]  methocarbamol (ROBAXIN) 750 MG tablet Take 750 mg by mouth 4 (four) times daily.   Yes [provider]  oxyCODONE (ROXICODONE) 15 MG immediate release tablet Take 15 mg by mouth. Takes 1 tablet 8 times daily 07/10/17  Yes [provider]  promethazine (PHENERGAN) 12.5 MG tablet Take 12.5 mg by mouth every 6 (six) hours as needed for nausea or vomiting.  07/09/17  Yes [provider]  rosuvastatin (CRESTOR) 20 MG tablet Take 20 mg by mouth daily.  07/05/17  Yes [provider]  TRESIBA FLEXTOUCH 200 UNIT/ML SOPN Inject 32 Units into the skin daily. 05/21/17  Yes [provider]  TRULICITY 1.5 XK/5.5VZ SOPN Inject 1.5 mg into the skin every Thursday.  07/08/17  Yes [provider]  varenicline (CHANTIX) 1 MG tablet Take 1 mg by mouth 2 (two) times daily. 10/27/17  Yes [provider]     Allergies as of 09/20/2021   (No Known Allergies)    Family History  Problem Relation Age of Onset   Lung cancer Mother    Heart attack Father    Kidney cancer Sister    Heart attack Brother    Leukemia Sister    Cancer Sister        bone marrow   Colon cancer Neg Hx     Social History   Socioeconomic History   Marital status: Married    Spouse name: Not on file   Number of children: Not on file   Years of education: Not on file   Highest education level: Not on file  Occupational History   Not on file  Tobacco Use   Smoking status: Every Day    Packs/day: 2.00    Years: 40.00    Pack years: 80.00    Types: Cigarettes   Smokeless tobacco: Never  Vaping Use   Vaping Use: Never used  Substance and Sexual Activity   Alcohol use: Yes    Comment: rare   Drug use: No   Sexual activity: Yes    Birth control/protection: None  Other Topics Concern   Not on file  Social History Narrative   Not on file   Social Determinants of Health   Financial Resource Strain: Not on file  Food Insecurity: Not on file  Transportation Needs: Not on file  Physical Activity: Not on file  Stress: Not on file  Social Connections: Not on file  Intimate Partner Violence: Not on file    Review of Systems: See HPI, otherwise negative ROS  Physical Exam: BP 137/80    Pulse (!) 104    Temp (!) 97.1 F (36.2 C) (Temporal)    Ht 6\' 4"  (1.93 m)    Wt 233 lb 9.6 oz (106 kg)    BMI 28.43 kg/m  General:   Alert,   well-nourished, pleasant and cooperative in NAD; accompanied by his wife. Neck:  Supple; no masses or thyromegaly. No significant cervical adenopathy. Lungs:  Clear throughout to auscultation.   No wheezes, crackles, or rhonchi. No acute distress. Heart:  Regular rate and rhythm; no murmurs, clicks, rubs,  or gallops. Abdomen: Non-distended, normal bowel sounds.  Soft and nontender without appreciable mass or hepatosplenomegaly.  Pulses:  Normal pulses noted. Extremities:   Without clubbing or edema.  Impression/Plan: Very pleasant 65 year old gentleman with a personal history of colonic adenoma and diverticulosis on colonoscopy 2018; due for surveillance colonoscopy in the very near future who presents with abnormal PET scan suggesting increased uptake in the rectosigmoid area.  He is reported to be Hemoccult positive.  He is not anemic.  He has no lower GI tract symptoms.  While the abnormal area of the rectosigmoid on PET scanning could be a false positive, further evaluation is warranted.  Moreover, he is Hemoccult positive and has a personal history of colonic polyps.  He needs an updated colonoscopy.  Recommendations:  As discussed, we will schedule  a diagnostic colonoscopy (abnormal CT imaging of colon).  ASA 3/propofol.  The risks, benefits, limitations, alternatives and imponderables have been reviewed with the patient. Questions have been answered. All parties are agreeable.    Further recommendations to follow after colonoscopy has been performed   Notice: This dictation was prepared with Dragon dictation along with smaller phrase technology. Any transcriptional errors that result from this process are unintentional and may not be corrected upon review.

## 2021-09-20 NOTE — Patient Instructions (Signed)
It was good seeing you again today!  As discussed, we will schedule a diagnostic colonoscopy (abnormal CT imaging of your colon).  ASA 3/propofol  Further recommendations to follow after colonoscopy has been performed  Happy new year!

## 2021-10-03 ENCOUNTER — Telehealth: Payer: Self-pay | Admitting: *Deleted

## 2021-10-03 MED ORDER — PEG 3350-KCL-NA BICARB-NACL 420 G PO SOLR: ORAL | 0 refills | Status: AC

## 2021-10-03 NOTE — Telephone Encounter (Signed)
CALLED PT. Patient scheduled for colonoscopy with propofol, asa 3 Dr Gala Romney on 1/19 at 8:15am. Will call back with pre-op appt. Rx sent to [facility] and instructions faxed. Spouse aware if they don't give them the instructions we faxed to call us back

## 2021-10-04 NOTE — Telephone Encounter (Signed)
Called spouse is aware of pre-op appt details.

## 2021-10-04 NOTE — Telephone Encounter (Signed)
PA approved via [facility] website. Auth# [id number], DOS: Oct 11, 2021 - Jan 09, 2022

## 2021-10-05 NOTE — Patient Instructions (Signed)
Tyrone Cohen  10/05/2021     @PREFPERIOPPHARMACY @   Your procedure is scheduled on  10/11/2021.   Report to [facility] at  [phone] A.M.   Call this number if you have problems the morning of surgery:  [phone]   Remember:  Follow the diet and prep instructions given to you by the office.    Take 16 units of tresiba the night before your procedur.    DO NOT take any medications for diabetes the morning of your procedure.     Take these medicines the morning of surgery with A SIP OF WATER           gabapentin, robaxin (if needed), oxycodone (if needed).     Do not wear jewelry, make-up or nail polish.  Do not wear lotions, powders, or perfumes, or deodorant.  Do not shave 48 hours prior to surgery.  Men may shave face and neck.  Do not bring valuables to the hospital.  [facility] is not responsible for any belongings or valuables.  Contacts, dentures or bridgework may not be worn into surgery.  Leave your suitcase in the car.  After surgery it may be brought to your room.  For patients admitted to the hospital, discharge time will be determined by your treatment team.  Patients discharged the day of surgery will not be allowed to drive home and must have someone with them for 24 hours.    Special instructions:   DO NOT smoke tobacco or vape for 24 hours before your procedure.  Please read over the following fact sheets that you were given. Anesthesia Post-op Instructions and Care and Recovery After Surgery      Colonoscopy, Adult, Care After This sheet gives you information about how to care for yourself after your procedure. Your health care provider may also give you more specific instructions. If you have problems or questions, contact your health care provider. What can I expect after the procedure? After the procedure, it is common to have: A small amount of blood in your stool for 24 hours after the procedure. Some gas. Mild cramping or bloating  of your abdomen. Follow these instructions at home: Eating and drinking  Drink enough fluid to keep your urine pale yellow. Follow instructions from your health care provider about eating or drinking restrictions. Resume your normal diet as instructed by your health care provider. Avoid heavy or fried foods that are hard to digest. Activity Rest as told by your health care provider. Avoid sitting for a long time without moving. Get up to take short walks every 1-2 hours. This is important to improve blood flow and breathing. Ask for help if you feel weak or unsteady. Return to your normal activities as told by your health care provider. Ask your health care provider what activities are safe for you. Managing cramping and bloating  Try walking around when you have cramps or feel bloated. Apply heat to your abdomen as told by your health care provider. Use the heat source that your health care provider recommends, such as a moist heat pack or a heating pad. Place a towel between your skin and the heat source. Leave the heat on for 20-30 minutes. Remove the heat if your skin turns bright red. This is especially important if you are unable to feel pain, heat, or cold. You may have a greater risk of getting burned. General instructions If you were given a sedative during the procedure,  it can affect you for several hours. Do not drive or operate machinery until your health care provider says that it is safe. For the first 24 hours after the procedure: Do not sign important documents. Do not drink alcohol. Do your regular daily activities at a slower pace than normal. Eat soft foods that are easy to digest. Take over-the-counter and prescription medicines only as told by your health care provider. Keep all follow-up visits as told by your health care provider. This is important. Contact a health care provider if: You have blood in your stool 2-3 days after the procedure. Get help right away if  you have: More than a small spotting of blood in your stool. Large blood clots in your stool. Swelling of your abdomen. Nausea or vomiting. A fever. Increasing pain in your abdomen that is not relieved with medicine. Summary After the procedure, it is common to have a small amount of blood in your stool. You may also have mild cramping and bloating of your abdomen. If you were given a sedative during the procedure, it can affect you for several hours. Do not drive or operate machinery until your health care provider says that it is safe. Get help right away if you have a lot of blood in your stool, nausea or vomiting, a fever, or increased pain in your abdomen. This information is not intended to replace advice given to you by your health care provider. Make sure you discuss any questions you have with your health care provider. Document Revised: 07/16/2019 Document Reviewed: 04/05/2019 Elsevier Patient Education  [place] After This sheet gives you information about how to care for yourself after your procedure. Your health care provider may also give you more specific instructions. If you have problems or questions, contact your health care provider. What can I expect after the procedure? After the procedure, it is common to have: Tiredness. Forgetfulness about what happened after the procedure. Impaired judgment for important decisions. Nausea or vomiting. Some difficulty with balance. Follow these instructions at home: For the time period you were told by your health care provider:   Rest as needed. Do not participate in activities where you could fall or become injured. Do not drive or use machinery. Do not drink alcohol. Do not take sleeping pills or medicines that cause drowsiness. Do not make important decisions or sign legal documents. Do not take care of children on your own. Eating and drinking Follow the diet that is recommended  by your health care provider. Drink enough fluid to keep your urine pale yellow. If you vomit: Drink water, juice, or soup when you can drink without vomiting. Make sure you have little or no nausea before eating solid foods. General instructions Have a responsible adult stay with you for the time you are told. It is important to have someone help care for you until you are awake and alert. Take over-the-counter and prescription medicines only as told by your health care provider. If you have sleep apnea, surgery and certain medicines can increase your risk for breathing problems. Follow instructions from your health care provider about wearing your sleep device: Anytime you are sleeping, including during daytime naps. While taking prescription pain medicines, sleeping medicines, or medicines that make you drowsy. Avoid smoking. Keep all follow-up visits as told by your health care provider. This is important. Contact a health care provider if: You keep feeling nauseous or you keep vomiting. You feel light-headed. You are  still sleepy or having trouble with balance after 24 hours. You develop a rash. You have a fever. You have redness or swelling around the IV site. Get help right away if: You have trouble breathing. You have new-onset confusion at home. Summary For several hours after your procedure, you may feel tired. You may also be forgetful and have poor judgment. Have a responsible adult stay with you for the time you are told. It is important to have someone help care for you until you are awake and alert. Rest as told. Do not drive or operate machinery. Do not drink alcohol or take sleeping pills. Get help right away if you have trouble breathing, or if you suddenly become confused. This information is not intended to replace advice given to you by your health care provider. Make sure you discuss any questions you have with your health care provider. Document Revised: 05/25/2020  Document Reviewed: 08/12/2019 Elsevier Patient Education  2022 [employer].

## 2021-10-09 ENCOUNTER — Encounter (HOSPITAL_COMMUNITY)
Admission: RE | Admit: 2021-10-09 | Discharge: 2021-10-09 | Disposition: A | Discharge status: Home / Self Care | Payer: Medicare Other | Source: Ambulatory Visit | Attending: Internal Medicine | Admitting: Internal Medicine

## 2021-10-09 ENCOUNTER — Other Ambulatory Visit: Payer: Self-pay

## 2021-10-09 ENCOUNTER — Encounter (HOSPITAL_COMMUNITY): Payer: Self-pay

## 2021-10-09 DIAGNOSIS — Z01810 Encounter for preprocedural cardiovascular examination: Secondary | ICD-10-CM | POA: Diagnosis present

## 2021-10-09 HISTORY — DX: Nausea with vomiting, unspecified: R11.2

## 2021-10-09 HISTORY — DX: Chronic myeloproliferative disease: D47.1

## 2021-10-09 HISTORY — DX: Nausea with vomiting, unspecified: Z98.890

## 2021-10-09 HISTORY — DX: Malignant neoplasm of unspecified kidney, except renal pelvis: C64.9

## 2021-10-11 ENCOUNTER — Other Ambulatory Visit: Payer: Self-pay

## 2021-10-11 ENCOUNTER — Encounter (HOSPITAL_COMMUNITY): Payer: Self-pay | Admitting: Internal Medicine

## 2021-10-11 ENCOUNTER — Ambulatory Visit (HOSPITAL_COMMUNITY)
Admission: RE | Admit: 2021-10-11 | Discharge: 2021-10-11 | Disposition: A | Discharge status: Home / Self Care | Payer: Medicare Other | Attending: Internal Medicine | Admitting: Internal Medicine

## 2021-10-11 ENCOUNTER — Encounter (HOSPITAL_COMMUNITY)
Admission: RE | Disposition: A | Discharge status: Home / Self Care | Payer: Self-pay | Source: Home / Self Care | Attending: Internal Medicine

## 2021-10-11 ENCOUNTER — Ambulatory Visit (HOSPITAL_COMMUNITY): Payer: Medicare Other | Admitting: Anesthesiology

## 2021-10-11 DIAGNOSIS — Z905 Acquired absence of kidney: Secondary | ICD-10-CM | POA: Diagnosis not present

## 2021-10-11 DIAGNOSIS — Z7984 Long term (current) use of oral hypoglycemic drugs: Secondary | ICD-10-CM | POA: Diagnosis not present

## 2021-10-11 DIAGNOSIS — E119 Type 2 diabetes mellitus without complications: Secondary | ICD-10-CM | POA: Diagnosis not present

## 2021-10-11 DIAGNOSIS — Z86010 Personal history of colonic polyps: Secondary | ICD-10-CM | POA: Insufficient documentation

## 2021-10-11 DIAGNOSIS — M1990 Unspecified osteoarthritis, unspecified site: Secondary | ICD-10-CM | POA: Diagnosis not present

## 2021-10-11 DIAGNOSIS — I10 Essential (primary) hypertension: Secondary | ICD-10-CM | POA: Diagnosis not present

## 2021-10-11 DIAGNOSIS — Z85528 Personal history of other malignant neoplasm of kidney: Secondary | ICD-10-CM | POA: Diagnosis not present

## 2021-10-11 DIAGNOSIS — K5730 Diverticulosis of large intestine without perforation or abscess without bleeding: Secondary | ICD-10-CM | POA: Diagnosis not present

## 2021-10-11 DIAGNOSIS — R195 Other fecal abnormalities: Secondary | ICD-10-CM | POA: Insufficient documentation

## 2021-10-11 DIAGNOSIS — D122 Benign neoplasm of ascending colon: Secondary | ICD-10-CM | POA: Diagnosis not present

## 2021-10-11 DIAGNOSIS — F419 Anxiety disorder, unspecified: Secondary | ICD-10-CM | POA: Diagnosis not present

## 2021-10-11 DIAGNOSIS — F17210 Nicotine dependence, cigarettes, uncomplicated: Secondary | ICD-10-CM | POA: Diagnosis not present

## 2021-10-11 HISTORY — PX: COLONOSCOPY WITH PROPOFOL: SHX5780

## 2021-10-11 HISTORY — PX: POLYPECTOMY: SHX5525

## 2021-10-11 LAB — GLUCOSE, CAPILLARY: Glucose-Capillary: 122 mg/dL — ABNORMAL HIGH (ref 70–99)

## 2021-10-11 SURGERY — COLONOSCOPY WITH PROPOFOL
Anesthesia: General

## 2021-10-11 MED ORDER — PROPOFOL 500 MG/50ML IV EMUL
INTRAVENOUS | Status: DC | PRN
  Administered 2021-10-11: 150 ug/kg/min via INTRAVENOUS
  Administered 2021-10-11: 125 ug/kg/min via INTRAVENOUS

## 2021-10-11 MED ORDER — LIDOCAINE HCL (CARDIAC) PF 100 MG/5ML IV SOSY
PREFILLED_SYRINGE | INTRAVENOUS | Status: DC | PRN
  Administered 2021-10-11: 50 mg via INTRAVENOUS

## 2021-10-11 MED ORDER — STERILE WATER FOR IRRIGATION IR SOLN
Status: DC | PRN
  Administered 2021-10-11: .6 mL

## 2021-10-11 MED ORDER — PROPOFOL 10 MG/ML IV BOLUS
INTRAVENOUS | Status: DC | PRN
Start: 2021-10-11 — End: 2021-10-11
  Administered 2021-10-11: 40 mg via INTRAVENOUS
  Administered 2021-10-11: 20 mg via INTRAVENOUS
  Administered 2021-10-11: 150 mg via INTRAVENOUS

## 2021-10-11 MED ORDER — LACTATED RINGERS IV SOLN: INTRAVENOUS | Status: DC

## 2021-10-11 NOTE — Anesthesia Postprocedure Evaluation (Signed)
Anesthesia Post Note  Patient: Tyrone Cohen  Procedure(s) Performed: COLONOSCOPY WITH PROPOFOL POLYPECTOMY  Patient location during evaluation: Endoscopy Anesthesia Type: General Level of consciousness: awake and alert Pain management: pain level controlled Vital Signs Assessment: post-procedure vital signs reviewed and stable Respiratory status: spontaneous breathing, nonlabored ventilation, respiratory function stable and patient connected to nasal cannula oxygen Cardiovascular status: blood pressure returned to baseline and stable Postop Assessment: no apparent nausea or vomiting Anesthetic complications: no   No notable events documented.   Last Vitals:  Vitals:   10/11/21 0655 10/11/21 1010  BP: (!) 151/78 (!) 106/54  Pulse: (!) 112 88  Resp: 12 14  Temp: 36.9 C 36.7 C  SpO2: 96% 100%    Last Pain:  Vitals:   10/11/21 1010  TempSrc: Axillary  PainSc: 0-No pain                 Trixie Rude

## 2021-10-11 NOTE — Anesthesia Preprocedure Evaluation (Signed)
Anesthesia Evaluation  Patient identified by MRN, date of birth, ID band Patient awake    Reviewed: Allergy & Precautions, H&P , NPO status , Patient's Chart, lab work & pertinent test results  History of Anesthesia Complications (+) PONV and history of anesthetic complications  Airway Mallampati: I  TM Distance: >3 FB     Dental  (+) Edentulous Upper, Edentulous Lower   Pulmonary neg pulmonary ROS, Current SmokerPatient did not abstain from smoking.,    breath sounds clear to auscultation       Cardiovascular hypertension, Pt. on medications negative cardio ROS   Rhythm:Regular Rate:Normal     Neuro/Psych PSYCHIATRIC DISORDERS Anxiety negative neurological ROS     GI/Hepatic negative GI ROS, Neg liver ROS, neg GERD  ,  Endo/Other  negative endocrine ROSdiabetes, Type 2, Oral Hypoglycemic Agents  Renal/GU Renal disease (ca hx)     Musculoskeletal  (+) Arthritis ,   Abdominal   Peds  Hematology negative hematology ROS (+)   Anesthesia Other Findings   Reproductive/Obstetrics                             Anesthesia Physical  Anesthesia Plan  ASA: 2  Anesthesia Plan: General   Post-op Pain Management:    Induction: Intravenous  PONV Risk Score and Plan: 2 and TIVA  Airway Management Planned: Nasal Cannula and Natural Airway  Additional Equipment:   Intra-op Plan:   Post-operative Plan:   Informed Consent: I have reviewed the patients History and Physical, chart, labs and discussed the procedure including the risks, benefits and alternatives for the proposed anesthesia with the patient or authorized representative who has indicated his/her understanding and acceptance.       Plan Discussed with:   Anesthesia Plan Comments:         Anesthesia Quick Evaluation

## 2021-10-11 NOTE — Transfer of Care (Signed)
Immediate Anesthesia Transfer of Care Note  Patient: Tyrone Cohen  Procedure(s) Performed: COLONOSCOPY WITH PROPOFOL POLYPECTOMY  Patient Location: Short Stay  Anesthesia Type:General  Level of Consciousness: awake and patient cooperative  Airway & Oxygen Therapy: Patient Spontanous Breathing  Post-op Assessment: Report given to RN and Post -op Vital signs reviewed and stable  Post vital signs: Reviewed and stable  Last Vitals:  Vitals Value Taken Time  BP    Temp    Pulse    Resp    SpO2      Last Pain:  Vitals:   10/11/21 0650  PainSc: 7          Complications: No notable events documented.

## 2021-10-11 NOTE — Anesthesia Procedure Notes (Signed)
Date/Time: 10/11/2021 9:40 AM Performed by: Vista Deck, CRNA Pre-anesthesia Checklist: Patient identified, Emergency Drugs available, Suction available, Timeout performed and Patient being monitored Patient Re-evaluated:Patient Re-evaluated prior to induction Oxygen Delivery Method: Nasal Cannula

## 2021-10-11 NOTE — Discharge Instructions (Signed)
°  Colonoscopy Discharge Instructions  Read the instructions outlined below and refer to this sheet in the next few weeks. These discharge instructions provide you with general information on caring for yourself after you leave the hospital. Your doctor may also give you specific instructions. While your treatment has been planned according to the most current medical practices available, unavoidable complications occasionally occur. If you have any problems or questions after discharge, call Dr. Gala Romney at [phone]. ACTIVITY You may resume your regular activity, but move at a slower pace for the next 24 hours.  Take frequent rest periods for the next 24 hours.  Walking will help get rid of the air and reduce the bloated feeling in your belly (abdomen).  No driving for 24 hours (because of the medicine (anesthesia) used during the test).   Do not sign any important legal documents or operate any machinery for 24 hours (because of the anesthesia used during the test).  NUTRITION Drink plenty of fluids.  You may resume your normal diet as instructed by your doctor.  Begin with a light meal and progress to your normal diet. Heavy or fried foods are harder to digest and may make you feel sick to your stomach (nauseated).  Avoid alcoholic beverages for 24 hours or as instructed.  MEDICATIONS You may resume your normal medications unless your doctor tells you otherwise.  WHAT YOU CAN EXPECT TODAY Some feelings of bloating in the abdomen.  Passage of more gas than usual.  Spotting of blood in your stool or on the toilet paper.  IF YOU HAD POLYPS REMOVED DURING THE COLONOSCOPY: No aspirin products for 7 days or as instructed.  No alcohol for 7 days or as instructed.  Eat a soft diet for the next 24 hours.  FINDING OUT THE RESULTS OF YOUR TEST Not all test results are available during your visit. If your test results are not back during the visit, make an appointment with your caregiver to find out the  results. Do not assume everything is normal if you have not heard from your caregiver or the medical facility. It is important for you to follow up on all of your test results.  SEEK IMMEDIATE MEDICAL ATTENTION IF: You have more than a spotting of blood in your stool.  Your belly is swollen (abdominal distention).  You are nauseated or vomiting.  You have a temperature over 101.  You have abdominal pain or discomfort that is severe or gets worse throughout the day.     2 small polyps removed from your colon  Colon polyp and diverticulosis information  provided   no tumor found in your colon   Or rectal!    Further recommendations to follow pending review of pathology report   at patient request, I called wife at [phone] -  reviewed findings.

## 2021-10-11 NOTE — Op Note (Addendum)
[facility] Patient Name: Tyrone Cohen Procedure Date: 10/11/2021 7:02 AM MRN: [id number] Date of Birth: [birth year] Attending MD: Norvel Richards , MD CSN: [id number] Age: 66 Admit Type: Outpatient Procedure:                Colonoscopy Indications:              Abnormal left colon / rectum on PET scan Providers:                Norvel Richards, MD, Gwynneth Albright RN,                            RN, Aram Candela Referring MD:              Medicines:                Propofol per Anesthesia Complications:            No immediate complications. Estimated Blood Loss:     Estimated blood loss was minimal. Procedure:                Pre-Anesthesia Assessment:                           - Prior to the procedure, a History and Physical                            was performed, and patient medications and                            allergies were reviewed. The patient's tolerance of                            previous anesthesia was also reviewed. The risks                            and benefits of the procedure and the sedation                            options and risks were discussed with the patient.                            All questions were answered, and informed consent                            was obtained. Prior Anticoagulants: The patient has                            taken no previous anticoagulant or antiplatelet                            agents. After reviewing the risks and benefits, the                            patient was deemed in satisfactory condition to  undergo the procedure.                           After obtaining informed consent, the colonoscope                            was passed under direct vision. Throughout the                            procedure, the patient's blood pressure, pulse, and                            oxygen saturations were monitored continuously. The                             [id number] scope was introduced through the                            anus and advanced to the the cecum, identified by                            appendiceal orifice and ileocecal valve. The                            ileocecal valve, appendiceal orifice, and rectum                            were photographed. The entire colon was well                            visualized. Scope In: 9:46:00 AM Scope Out: 10:03:47 AM Scope Withdrawal Time: 0 hours 10 minutes 22 seconds  Total Procedure Duration: 0 hours 17 minutes 47 seconds  Findings:      The perianal and digital rectal examinations were normal.      .      Two sessile polyps were found in the ascending colon. The polyps were 3       to 6 mm in size. These polyps were removed with a cold snare. Resection       and retrieval were complete. Estimated blood loss was minimal.      Scattered small and large-mouthed diverticula were found in the entire       colon.      The exam was otherwise without abnormality on direct and retroflexion       views. Impression:               - Two 3 to 6 mm polyps in the ascending colon,                            removed with a cold snare. Resected and retrieved.                           - Diverticulosis in the entire examined colon.                           -  The examination was otherwise normal on direct                            and retroflexion views. Moderate Sedation:      Moderate (conscious) sedation was personally administered by an       anesthesia professional. The following parameters were monitored: oxygen       saturation, heart rate, blood pressure, respiratory rate, EKG, adequacy       of pulmonary ventilation, and response to care. Recommendation:           - Patient has a contact number available for                            emergencies. The signs and symptoms of potential                            delayed complications were discussed with the                             patient. Return to normal activities tomorrow.                            Written discharge instructions were provided to the                            patient.                           - Discharge patient to home.                           - Patient has a contact number available for                            emergencies. The signs and symptoms of potential                            delayed complications were discussed with the                            patient. Return to normal activities tomorrow.                            Written discharge instructions were provided to the                            patient.                           - The patient will be observed post-procedure,                            until all discharge criteria are met. Procedure Code(s):        --- Professional ---  45385, Colonoscopy, flexible; with removal of                            tumor(s), polyp(s), or other lesion(s) by snare                            technique Diagnosis Code(s):        --- Professional ---                           K63.5, Polyp of colon                           R19.5, Other fecal abnormalities                           K57.30, Diverticulosis of large intestine without                            perforation or abscess without bleeding CPT copyright 2019 [employer]. All rights reserved. The codes documented in this report are preliminary and upon coder review may  be revised to meet current compliance requirements. Cristopher Estimable. Allannah Kempen, MD Norvel Richards, MD 10/11/2021 10:10:30 AM This report has been signed electronically. Number of Addenda: 0

## 2021-10-11 NOTE — Interval H&P Note (Signed)
History and Physical Interval Note:  10/11/2021 9:35 AM  Tyrone Cohen  has presented today for surgery, with the diagnosis of abnormal CT of colon.  The various methods of treatment have been discussed with the patient and family. After consideration of risks, benefits and other options for treatment, the patient has consented to  Procedure(s) with comments: COLONOSCOPY WITH PROPOFOL (N/A) - 8:15am as a surgical intervention.  The patient's history has been reviewed, patient examined, no change in status, stable for surgery.  I have reviewed the patient's chart and labs.  Questions were answered to the patient's satisfaction.     Teoman Giraud   no change.  Diagnostic colonoscopy per plan.  The risks, benefits, limitations, alternatives and imponderables have been reviewed with the patient. Questions have been answered. All parties are agreeable.

## 2021-10-12 ENCOUNTER — Encounter: Payer: Self-pay | Admitting: Internal Medicine

## 2021-10-12 LAB — SURGICAL PATHOLOGY

## 2021-10-15 ENCOUNTER — Encounter (HOSPITAL_COMMUNITY): Payer: Self-pay | Admitting: Internal Medicine

## 2021-10-23 ENCOUNTER — Ambulatory Visit: Payer: Medicare Other | Admitting: Internal Medicine

## 2022-06-05 ENCOUNTER — Other Ambulatory Visit: Payer: Self-pay | Admitting: Nurse Practitioner

## 2022-07-05 ENCOUNTER — Other Ambulatory Visit: Payer: Self-pay | Admitting: Nurse Practitioner

## 2022-07-11 NOTE — Telephone Encounter (Signed)
Refill sent via patient's account with [facility]

## 2022-08-05 ENCOUNTER — Other Ambulatory Visit: Payer: Self-pay | Admitting: Nurse Practitioner

## 2023-02-26 DIAGNOSIS — M5137 Other intervertebral disc degeneration, lumbosacral region: Secondary | ICD-10-CM | POA: Diagnosis not present

## 2023-02-26 DIAGNOSIS — M47812 Spondylosis without myelopathy or radiculopathy, cervical region: Secondary | ICD-10-CM | POA: Diagnosis not present

## 2023-02-26 DIAGNOSIS — M5459 Other low back pain: Secondary | ICD-10-CM | POA: Diagnosis not present

## 2023-02-26 DIAGNOSIS — Z79899 Other long term (current) drug therapy: Secondary | ICD-10-CM | POA: Diagnosis not present

## 2023-02-26 DIAGNOSIS — F32A Depression, unspecified: Secondary | ICD-10-CM | POA: Diagnosis not present

## 2023-03-13 DIAGNOSIS — I10 Essential (primary) hypertension: Secondary | ICD-10-CM | POA: Diagnosis not present

## 2023-03-13 DIAGNOSIS — Z9911 Dependence on respirator [ventilator] status: Secondary | ICD-10-CM | POA: Diagnosis not present

## 2023-03-13 DIAGNOSIS — R509 Fever, unspecified: Secondary | ICD-10-CM | POA: Diagnosis not present

## 2023-03-13 DIAGNOSIS — Z87898 Personal history of other specified conditions: Secondary | ICD-10-CM | POA: Diagnosis not present

## 2023-03-13 DIAGNOSIS — J9612 Chronic respiratory failure with hypercapnia: Secondary | ICD-10-CM | POA: Diagnosis not present

## 2023-03-13 DIAGNOSIS — R4020 Unspecified coma: Secondary | ICD-10-CM | POA: Diagnosis not present

## 2023-03-13 DIAGNOSIS — Z4682 Encounter for fitting and adjustment of non-vascular catheter: Secondary | ICD-10-CM | POA: Diagnosis not present

## 2023-03-13 DIAGNOSIS — F17210 Nicotine dependence, cigarettes, uncomplicated: Secondary | ICD-10-CM | POA: Diagnosis not present

## 2023-03-13 DIAGNOSIS — E1159 Type 2 diabetes mellitus with other circulatory complications: Secondary | ICD-10-CM | POA: Diagnosis not present

## 2023-03-13 DIAGNOSIS — J9621 Acute and chronic respiratory failure with hypoxia: Secondary | ICD-10-CM | POA: Diagnosis not present

## 2023-03-13 DIAGNOSIS — Z905 Acquired absence of kidney: Secondary | ICD-10-CM | POA: Diagnosis not present

## 2023-03-13 DIAGNOSIS — J9690 Respiratory failure, unspecified, unspecified whether with hypoxia or hypercapnia: Secondary | ICD-10-CM | POA: Diagnosis not present

## 2023-03-13 DIAGNOSIS — G894 Chronic pain syndrome: Secondary | ICD-10-CM | POA: Diagnosis not present

## 2023-03-13 DIAGNOSIS — J9811 Atelectasis: Secondary | ICD-10-CM | POA: Diagnosis not present

## 2023-03-13 DIAGNOSIS — R0902 Hypoxemia: Secondary | ICD-10-CM | POA: Diagnosis not present

## 2023-03-13 DIAGNOSIS — C649 Malignant neoplasm of unspecified kidney, except renal pelvis: Secondary | ICD-10-CM | POA: Diagnosis not present

## 2023-03-13 DIAGNOSIS — Z9229 Personal history of other drug therapy: Secondary | ICD-10-CM | POA: Diagnosis not present

## 2023-03-13 DIAGNOSIS — R918 Other nonspecific abnormal finding of lung field: Secondary | ICD-10-CM | POA: Diagnosis not present

## 2023-03-13 DIAGNOSIS — G9341 Metabolic encephalopathy: Secondary | ICD-10-CM | POA: Diagnosis not present

## 2023-03-13 DIAGNOSIS — E1140 Type 2 diabetes mellitus with diabetic neuropathy, unspecified: Secondary | ICD-10-CM | POA: Diagnosis not present

## 2023-03-13 DIAGNOSIS — I6503 Occlusion and stenosis of bilateral vertebral arteries: Secondary | ICD-10-CM | POA: Diagnosis not present

## 2023-03-13 DIAGNOSIS — J9600 Acute respiratory failure, unspecified whether with hypoxia or hypercapnia: Secondary | ICD-10-CM | POA: Diagnosis not present

## 2023-03-13 DIAGNOSIS — Z720 Tobacco use: Secondary | ICD-10-CM | POA: Diagnosis not present

## 2023-03-13 DIAGNOSIS — R55 Syncope and collapse: Secondary | ICD-10-CM | POA: Diagnosis not present

## 2023-03-13 DIAGNOSIS — I6782 Cerebral ischemia: Secondary | ICD-10-CM | POA: Diagnosis not present

## 2023-03-13 DIAGNOSIS — J441 Chronic obstructive pulmonary disease with (acute) exacerbation: Secondary | ICD-10-CM | POA: Diagnosis not present

## 2023-03-13 DIAGNOSIS — G8191 Hemiplegia, unspecified affecting right dominant side: Secondary | ICD-10-CM | POA: Diagnosis not present

## 2023-03-13 DIAGNOSIS — J690 Pneumonitis due to inhalation of food and vomit: Secondary | ICD-10-CM | POA: Diagnosis not present

## 2023-03-13 DIAGNOSIS — Z794 Long term (current) use of insulin: Secondary | ICD-10-CM | POA: Diagnosis not present

## 2023-03-13 DIAGNOSIS — R404 Transient alteration of awareness: Secondary | ICD-10-CM | POA: Diagnosis not present

## 2023-03-13 DIAGNOSIS — Z9049 Acquired absence of other specified parts of digestive tract: Secondary | ICD-10-CM | POA: Diagnosis not present

## 2023-03-13 DIAGNOSIS — R531 Weakness: Secondary | ICD-10-CM | POA: Diagnosis not present

## 2023-03-13 DIAGNOSIS — J9601 Acute respiratory failure with hypoxia: Secondary | ICD-10-CM | POA: Diagnosis not present

## 2023-03-13 DIAGNOSIS — G8929 Other chronic pain: Secondary | ICD-10-CM | POA: Diagnosis not present

## 2023-03-13 DIAGNOSIS — I5189 Other ill-defined heart diseases: Secondary | ICD-10-CM | POA: Diagnosis not present

## 2023-03-13 DIAGNOSIS — E785 Hyperlipidemia, unspecified: Secondary | ICD-10-CM | POA: Diagnosis not present

## 2023-03-13 DIAGNOSIS — G9340 Encephalopathy, unspecified: Secondary | ICD-10-CM | POA: Diagnosis not present

## 2023-03-13 DIAGNOSIS — H748X3 Other specified disorders of middle ear and mastoid, bilateral: Secondary | ICD-10-CM | POA: Diagnosis not present

## 2023-03-13 DIAGNOSIS — R9082 White matter disease, unspecified: Secondary | ICD-10-CM | POA: Diagnosis not present

## 2023-03-13 DIAGNOSIS — E119 Type 2 diabetes mellitus without complications: Secondary | ICD-10-CM | POA: Diagnosis not present

## 2023-03-23 DIAGNOSIS — E119 Type 2 diabetes mellitus without complications: Secondary | ICD-10-CM | POA: Diagnosis not present

## 2023-03-25 DIAGNOSIS — Z299 Encounter for prophylactic measures, unspecified: Secondary | ICD-10-CM | POA: Diagnosis not present

## 2023-03-25 DIAGNOSIS — I10 Essential (primary) hypertension: Secondary | ICD-10-CM | POA: Diagnosis not present

## 2023-03-25 DIAGNOSIS — F17210 Nicotine dependence, cigarettes, uncomplicated: Secondary | ICD-10-CM | POA: Diagnosis not present

## 2023-03-26 DIAGNOSIS — Z79899 Other long term (current) drug therapy: Secondary | ICD-10-CM | POA: Diagnosis not present

## 2023-03-26 DIAGNOSIS — M47812 Spondylosis without myelopathy or radiculopathy, cervical region: Secondary | ICD-10-CM | POA: Diagnosis not present

## 2023-03-26 DIAGNOSIS — M5459 Other low back pain: Secondary | ICD-10-CM | POA: Diagnosis not present

## 2023-03-26 DIAGNOSIS — F32A Depression, unspecified: Secondary | ICD-10-CM | POA: Diagnosis not present

## 2023-03-26 DIAGNOSIS — M5137 Other intervertebral disc degeneration, lumbosacral region: Secondary | ICD-10-CM | POA: Diagnosis not present

## 2023-04-07 DIAGNOSIS — E119 Type 2 diabetes mellitus without complications: Secondary | ICD-10-CM | POA: Diagnosis not present

## 2023-04-23 DIAGNOSIS — M5459 Other low back pain: Secondary | ICD-10-CM | POA: Diagnosis not present

## 2023-04-23 DIAGNOSIS — E119 Type 2 diabetes mellitus without complications: Secondary | ICD-10-CM | POA: Diagnosis not present

## 2023-04-23 DIAGNOSIS — Z79899 Other long term (current) drug therapy: Secondary | ICD-10-CM | POA: Diagnosis not present

## 2023-04-23 DIAGNOSIS — M47812 Spondylosis without myelopathy or radiculopathy, cervical region: Secondary | ICD-10-CM | POA: Diagnosis not present

## 2023-04-23 DIAGNOSIS — M5137 Other intervertebral disc degeneration, lumbosacral region: Secondary | ICD-10-CM | POA: Diagnosis not present

## 2023-04-23 DIAGNOSIS — F32A Depression, unspecified: Secondary | ICD-10-CM | POA: Diagnosis not present

## 2023-04-25 DIAGNOSIS — Z79899 Other long term (current) drug therapy: Secondary | ICD-10-CM | POA: Diagnosis not present

## 2023-05-12 DIAGNOSIS — F17210 Nicotine dependence, cigarettes, uncomplicated: Secondary | ICD-10-CM | POA: Diagnosis not present

## 2023-05-12 DIAGNOSIS — Z299 Encounter for prophylactic measures, unspecified: Secondary | ICD-10-CM | POA: Diagnosis not present

## 2023-05-12 DIAGNOSIS — Z85528 Personal history of other malignant neoplasm of kidney: Secondary | ICD-10-CM | POA: Diagnosis not present

## 2023-05-12 DIAGNOSIS — I10 Essential (primary) hypertension: Secondary | ICD-10-CM | POA: Diagnosis not present

## 2023-05-12 DIAGNOSIS — E1142 Type 2 diabetes mellitus with diabetic polyneuropathy: Secondary | ICD-10-CM | POA: Diagnosis not present

## 2023-05-13 DIAGNOSIS — K8020 Calculus of gallbladder without cholecystitis without obstruction: Secondary | ICD-10-CM | POA: Diagnosis not present

## 2023-05-13 DIAGNOSIS — I700 Atherosclerosis of aorta: Secondary | ICD-10-CM | POA: Diagnosis not present

## 2023-05-13 DIAGNOSIS — C642 Malignant neoplasm of left kidney, except renal pelvis: Secondary | ICD-10-CM | POA: Diagnosis not present

## 2023-05-13 DIAGNOSIS — R918 Other nonspecific abnormal finding of lung field: Secondary | ICD-10-CM | POA: Diagnosis not present

## 2023-05-13 DIAGNOSIS — N2889 Other specified disorders of kidney and ureter: Secondary | ICD-10-CM | POA: Diagnosis not present

## 2023-05-13 DIAGNOSIS — J439 Emphysema, unspecified: Secondary | ICD-10-CM | POA: Diagnosis not present

## 2023-05-21 DIAGNOSIS — Z79899 Other long term (current) drug therapy: Secondary | ICD-10-CM | POA: Diagnosis not present

## 2023-05-21 DIAGNOSIS — M5459 Other low back pain: Secondary | ICD-10-CM | POA: Diagnosis not present

## 2023-05-21 DIAGNOSIS — M5137 Other intervertebral disc degeneration, lumbosacral region: Secondary | ICD-10-CM | POA: Diagnosis not present

## 2023-05-21 DIAGNOSIS — M47812 Spondylosis without myelopathy or radiculopathy, cervical region: Secondary | ICD-10-CM | POA: Diagnosis not present

## 2023-05-21 DIAGNOSIS — F32A Depression, unspecified: Secondary | ICD-10-CM | POA: Diagnosis not present

## 2023-05-22 DIAGNOSIS — D751 Secondary polycythemia: Secondary | ICD-10-CM | POA: Diagnosis not present

## 2023-05-22 DIAGNOSIS — N2889 Other specified disorders of kidney and ureter: Secondary | ICD-10-CM | POA: Diagnosis not present

## 2023-05-22 DIAGNOSIS — C642 Malignant neoplasm of left kidney, except renal pelvis: Secondary | ICD-10-CM | POA: Diagnosis not present

## 2023-05-24 DIAGNOSIS — E119 Type 2 diabetes mellitus without complications: Secondary | ICD-10-CM | POA: Diagnosis not present

## 2023-06-18 DIAGNOSIS — M5137 Other intervertebral disc degeneration, lumbosacral region: Secondary | ICD-10-CM | POA: Diagnosis not present

## 2023-06-18 DIAGNOSIS — M5459 Other low back pain: Secondary | ICD-10-CM | POA: Diagnosis not present

## 2023-06-18 DIAGNOSIS — Z79899 Other long term (current) drug therapy: Secondary | ICD-10-CM | POA: Diagnosis not present

## 2023-06-18 DIAGNOSIS — M47812 Spondylosis without myelopathy or radiculopathy, cervical region: Secondary | ICD-10-CM | POA: Diagnosis not present

## 2023-06-23 DIAGNOSIS — E119 Type 2 diabetes mellitus without complications: Secondary | ICD-10-CM | POA: Diagnosis not present

## 2023-07-16 DIAGNOSIS — M51379 Other intervertebral disc degeneration, lumbosacral region without mention of lumbar back pain or lower extremity pain: Secondary | ICD-10-CM | POA: Diagnosis not present

## 2023-07-16 DIAGNOSIS — M47812 Spondylosis without myelopathy or radiculopathy, cervical region: Secondary | ICD-10-CM | POA: Diagnosis not present

## 2023-07-16 DIAGNOSIS — M5459 Other low back pain: Secondary | ICD-10-CM | POA: Diagnosis not present

## 2023-07-16 DIAGNOSIS — Z79899 Other long term (current) drug therapy: Secondary | ICD-10-CM | POA: Diagnosis not present

## 2023-07-16 DIAGNOSIS — Z23 Encounter for immunization: Secondary | ICD-10-CM | POA: Diagnosis not present

## 2023-07-23 DIAGNOSIS — E119 Type 2 diabetes mellitus without complications: Secondary | ICD-10-CM | POA: Diagnosis not present

## 2023-08-12 DIAGNOSIS — R52 Pain, unspecified: Secondary | ICD-10-CM | POA: Diagnosis not present

## 2023-08-12 DIAGNOSIS — Z7189 Other specified counseling: Secondary | ICD-10-CM | POA: Diagnosis not present

## 2023-08-12 DIAGNOSIS — Z0000 Encounter for general adult medical examination without abnormal findings: Secondary | ICD-10-CM | POA: Diagnosis not present

## 2023-08-12 DIAGNOSIS — Z299 Encounter for prophylactic measures, unspecified: Secondary | ICD-10-CM | POA: Diagnosis not present

## 2023-08-12 DIAGNOSIS — I10 Essential (primary) hypertension: Secondary | ICD-10-CM | POA: Diagnosis not present

## 2023-08-12 DIAGNOSIS — F17210 Nicotine dependence, cigarettes, uncomplicated: Secondary | ICD-10-CM | POA: Diagnosis not present

## 2023-08-13 DIAGNOSIS — M5459 Other low back pain: Secondary | ICD-10-CM | POA: Diagnosis not present

## 2023-08-13 DIAGNOSIS — M47812 Spondylosis without myelopathy or radiculopathy, cervical region: Secondary | ICD-10-CM | POA: Diagnosis not present

## 2023-08-13 DIAGNOSIS — Z79899 Other long term (current) drug therapy: Secondary | ICD-10-CM | POA: Diagnosis not present

## 2023-08-13 DIAGNOSIS — M51379 Other intervertebral disc degeneration, lumbosacral region without mention of lumbar back pain or lower extremity pain: Secondary | ICD-10-CM | POA: Diagnosis not present

## 2023-08-15 DIAGNOSIS — Z79899 Other long term (current) drug therapy: Secondary | ICD-10-CM | POA: Diagnosis not present

## 2023-08-22 DIAGNOSIS — E119 Type 2 diabetes mellitus without complications: Secondary | ICD-10-CM | POA: Diagnosis not present

## 2023-08-29 DIAGNOSIS — K8689 Other specified diseases of pancreas: Secondary | ICD-10-CM | POA: Diagnosis not present

## 2023-08-29 DIAGNOSIS — Z85528 Personal history of other malignant neoplasm of kidney: Secondary | ICD-10-CM | POA: Diagnosis not present

## 2023-08-29 DIAGNOSIS — K838 Other specified diseases of biliary tract: Secondary | ICD-10-CM | POA: Diagnosis not present

## 2023-08-29 DIAGNOSIS — Z905 Acquired absence of kidney: Secondary | ICD-10-CM | POA: Diagnosis not present

## 2023-08-29 DIAGNOSIS — N289 Disorder of kidney and ureter, unspecified: Secondary | ICD-10-CM | POA: Diagnosis not present

## 2023-09-08 DIAGNOSIS — N2889 Other specified disorders of kidney and ureter: Secondary | ICD-10-CM | POA: Diagnosis not present

## 2023-09-08 DIAGNOSIS — C642 Malignant neoplasm of left kidney, except renal pelvis: Secondary | ICD-10-CM | POA: Diagnosis not present

## 2023-09-09 DIAGNOSIS — F17210 Nicotine dependence, cigarettes, uncomplicated: Secondary | ICD-10-CM | POA: Diagnosis not present

## 2023-09-09 DIAGNOSIS — R52 Pain, unspecified: Secondary | ICD-10-CM | POA: Diagnosis not present

## 2023-09-09 DIAGNOSIS — I10 Essential (primary) hypertension: Secondary | ICD-10-CM | POA: Diagnosis not present

## 2023-09-09 DIAGNOSIS — Z299 Encounter for prophylactic measures, unspecified: Secondary | ICD-10-CM | POA: Diagnosis not present

## 2023-09-09 DIAGNOSIS — C642 Malignant neoplasm of left kidney, except renal pelvis: Secondary | ICD-10-CM | POA: Diagnosis not present

## 2023-09-09 DIAGNOSIS — E1169 Type 2 diabetes mellitus with other specified complication: Secondary | ICD-10-CM | POA: Diagnosis not present

## 2023-09-10 DIAGNOSIS — Z79899 Other long term (current) drug therapy: Secondary | ICD-10-CM | POA: Diagnosis not present

## 2023-09-10 DIAGNOSIS — M51379 Other intervertebral disc degeneration, lumbosacral region without mention of lumbar back pain or lower extremity pain: Secondary | ICD-10-CM | POA: Diagnosis not present

## 2023-09-10 DIAGNOSIS — M47812 Spondylosis without myelopathy or radiculopathy, cervical region: Secondary | ICD-10-CM | POA: Diagnosis not present

## 2023-09-10 DIAGNOSIS — M5459 Other low back pain: Secondary | ICD-10-CM | POA: Diagnosis not present

## 2023-09-22 DIAGNOSIS — E119 Type 2 diabetes mellitus without complications: Secondary | ICD-10-CM | POA: Diagnosis not present

## 2023-09-27 IMAGING — CT NM PET TUM IMG INITIAL (PI) SKULL BASE T - THIGH
1 of 7 series · 1 of 25 positions shown · non-contrast
Comparison: CT scan 06/18/2021 and MRI 06/27/2021

CLINICAL DATA: Initial treatment strategy for left renal mass.

EXAM:
NUCLEAR MEDICINE PET SKULL BASE TO THIGH
TECHNIQUE: 13.36 mCi F-18 FDG was injected intravenously. Full-ring PET imaging
was performed from the skull base to thigh after the radiotracer. CT
data was obtained and used for attenuation correction and anatomic
localization.
Fasting blood glucose: 136 mg/dl

[Series 3: ctac · axial · 3.0mm · 0.98mm/px · 1 of 352 slices shown]
[im 352/352  brain]
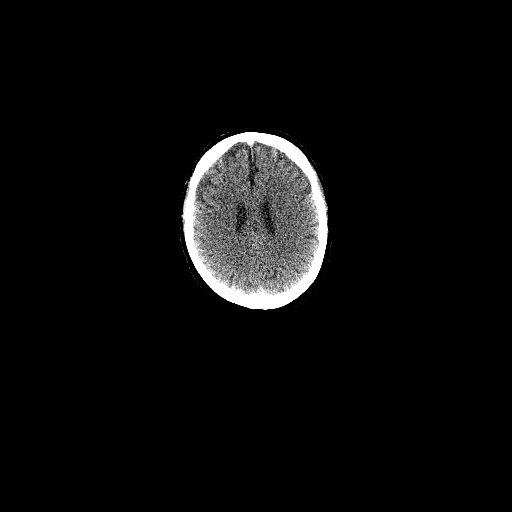

[1 of 25 positions shown; findings below may reference images not displayed]

FINDINGS: Mediastinal blood pool activity: SUV max

Liver activity: SUV max NA

NECK: No hypermetabolic lymph nodes in the neck.

Incidental CT findings: none

CHEST: No hypermetabolic mediastinal or hilar nodes. No suspicious
pulmonary nodules on the CT scan.

Incidental CT findings: Emphysematous changes and pulmonary
scarring. Mild atherosclerotic calcifications involving the thoracic
aorta and branch vessels.

ABDOMEN/PELVIS: The partially necrotic appearing 7 cm left renal
mass does not show any appreciable FDG uptake. SUV max is 2.56. This
is not atypical for some renal neoplasms.

No findings suspicious for metastatic disease although metastatic
disease would likely not be hypermetabolic either. No hepatic or
adrenal gland lesions are identified. No enlarged mesenteric or
retroperitoneal lymph nodes.

Incidental CT findings: Stable small right adrenal gland adenoma.
Scattered aortic and iliac artery calcifications. No aneurysm.
Cholelithiasis noted.

SKELETON: No findings suspicious for osseous metastatic disease.
There are few small scattered sclerotic lesions which are likely
benign bone islands.

Incidental CT findings: none
IMPRESSION: 1. The large left renal mass does not show any appreciable FDG
uptake which is not atypical for some renal neoplasms.
2. No findings to suggest metastatic disease.
3. Advanced emphysematous changes and pulmonary scarring and age
advanced vascular calcifications.

## 2023-10-03 DIAGNOSIS — M5459 Other low back pain: Secondary | ICD-10-CM | POA: Diagnosis not present

## 2023-10-03 DIAGNOSIS — G5791 Unspecified mononeuropathy of right lower limb: Secondary | ICD-10-CM | POA: Diagnosis not present

## 2023-10-03 DIAGNOSIS — M51379 Other intervertebral disc degeneration, lumbosacral region without mention of lumbar back pain or lower extremity pain: Secondary | ICD-10-CM | POA: Diagnosis not present

## 2023-10-03 DIAGNOSIS — M47812 Spondylosis without myelopathy or radiculopathy, cervical region: Secondary | ICD-10-CM | POA: Diagnosis not present

## 2023-10-03 DIAGNOSIS — Z79899 Other long term (current) drug therapy: Secondary | ICD-10-CM | POA: Diagnosis not present

## 2023-10-07 DIAGNOSIS — C649 Malignant neoplasm of unspecified kidney, except renal pelvis: Secondary | ICD-10-CM | POA: Diagnosis not present

## 2023-10-07 DIAGNOSIS — C642 Malignant neoplasm of left kidney, except renal pelvis: Secondary | ICD-10-CM | POA: Diagnosis not present

## 2023-10-22 DIAGNOSIS — E119 Type 2 diabetes mellitus without complications: Secondary | ICD-10-CM | POA: Diagnosis not present

## 2023-10-31 DIAGNOSIS — C649 Malignant neoplasm of unspecified kidney, except renal pelvis: Secondary | ICD-10-CM | POA: Diagnosis not present

## 2023-10-31 DIAGNOSIS — M51379 Other intervertebral disc degeneration, lumbosacral region without mention of lumbar back pain or lower extremity pain: Secondary | ICD-10-CM | POA: Diagnosis not present

## 2023-10-31 DIAGNOSIS — M47812 Spondylosis without myelopathy or radiculopathy, cervical region: Secondary | ICD-10-CM | POA: Diagnosis not present

## 2023-10-31 DIAGNOSIS — M5459 Other low back pain: Secondary | ICD-10-CM | POA: Diagnosis not present

## 2023-10-31 DIAGNOSIS — G5791 Unspecified mononeuropathy of right lower limb: Secondary | ICD-10-CM | POA: Diagnosis not present

## 2023-10-31 DIAGNOSIS — Z79899 Other long term (current) drug therapy: Secondary | ICD-10-CM | POA: Diagnosis not present

## 2023-11-21 DIAGNOSIS — E119 Type 2 diabetes mellitus without complications: Secondary | ICD-10-CM | POA: Diagnosis not present

## 2023-11-28 DIAGNOSIS — M51379 Other intervertebral disc degeneration, lumbosacral region without mention of lumbar back pain or lower extremity pain: Secondary | ICD-10-CM | POA: Diagnosis not present

## 2023-11-28 DIAGNOSIS — M47812 Spondylosis without myelopathy or radiculopathy, cervical region: Secondary | ICD-10-CM | POA: Diagnosis not present

## 2023-11-28 DIAGNOSIS — C649 Malignant neoplasm of unspecified kidney, except renal pelvis: Secondary | ICD-10-CM | POA: Diagnosis not present

## 2023-11-28 DIAGNOSIS — Z79899 Other long term (current) drug therapy: Secondary | ICD-10-CM | POA: Diagnosis not present

## 2023-11-28 DIAGNOSIS — M5459 Other low back pain: Secondary | ICD-10-CM | POA: Diagnosis not present

## 2023-11-28 DIAGNOSIS — G5791 Unspecified mononeuropathy of right lower limb: Secondary | ICD-10-CM | POA: Diagnosis not present

## 2023-12-05 DIAGNOSIS — C642 Malignant neoplasm of left kidney, except renal pelvis: Secondary | ICD-10-CM | POA: Diagnosis not present

## 2023-12-05 DIAGNOSIS — R918 Other nonspecific abnormal finding of lung field: Secondary | ICD-10-CM | POA: Diagnosis not present

## 2023-12-11 DIAGNOSIS — R52 Pain, unspecified: Secondary | ICD-10-CM | POA: Diagnosis not present

## 2023-12-11 DIAGNOSIS — E1169 Type 2 diabetes mellitus with other specified complication: Secondary | ICD-10-CM | POA: Diagnosis not present

## 2023-12-11 DIAGNOSIS — I10 Essential (primary) hypertension: Secondary | ICD-10-CM | POA: Diagnosis not present

## 2023-12-11 DIAGNOSIS — Z299 Encounter for prophylactic measures, unspecified: Secondary | ICD-10-CM | POA: Diagnosis not present

## 2023-12-11 DIAGNOSIS — M549 Dorsalgia, unspecified: Secondary | ICD-10-CM | POA: Diagnosis not present

## 2023-12-11 DIAGNOSIS — C642 Malignant neoplasm of left kidney, except renal pelvis: Secondary | ICD-10-CM | POA: Diagnosis not present

## 2023-12-21 DIAGNOSIS — E119 Type 2 diabetes mellitus without complications: Secondary | ICD-10-CM | POA: Diagnosis not present

## 2023-12-25 DIAGNOSIS — C649 Malignant neoplasm of unspecified kidney, except renal pelvis: Secondary | ICD-10-CM | POA: Diagnosis not present

## 2023-12-25 DIAGNOSIS — Z79899 Other long term (current) drug therapy: Secondary | ICD-10-CM | POA: Diagnosis not present

## 2023-12-25 DIAGNOSIS — M51379 Other intervertebral disc degeneration, lumbosacral region without mention of lumbar back pain or lower extremity pain: Secondary | ICD-10-CM | POA: Diagnosis not present

## 2023-12-25 DIAGNOSIS — M5459 Other low back pain: Secondary | ICD-10-CM | POA: Diagnosis not present

## 2023-12-25 DIAGNOSIS — G5791 Unspecified mononeuropathy of right lower limb: Secondary | ICD-10-CM | POA: Diagnosis not present

## 2023-12-25 DIAGNOSIS — M47812 Spondylosis without myelopathy or radiculopathy, cervical region: Secondary | ICD-10-CM | POA: Diagnosis not present

## 2024-01-21 DIAGNOSIS — Z79899 Other long term (current) drug therapy: Secondary | ICD-10-CM | POA: Diagnosis not present

## 2024-01-21 DIAGNOSIS — G5791 Unspecified mononeuropathy of right lower limb: Secondary | ICD-10-CM | POA: Diagnosis not present

## 2024-01-21 DIAGNOSIS — E119 Type 2 diabetes mellitus without complications: Secondary | ICD-10-CM | POA: Diagnosis not present

## 2024-01-21 DIAGNOSIS — M47812 Spondylosis without myelopathy or radiculopathy, cervical region: Secondary | ICD-10-CM | POA: Diagnosis not present

## 2024-01-21 DIAGNOSIS — C649 Malignant neoplasm of unspecified kidney, except renal pelvis: Secondary | ICD-10-CM | POA: Diagnosis not present

## 2024-01-21 DIAGNOSIS — M5459 Other low back pain: Secondary | ICD-10-CM | POA: Diagnosis not present

## 2024-02-16 DIAGNOSIS — C649 Malignant neoplasm of unspecified kidney, except renal pelvis: Secondary | ICD-10-CM | POA: Diagnosis not present

## 2024-02-16 DIAGNOSIS — M47812 Spondylosis without myelopathy or radiculopathy, cervical region: Secondary | ICD-10-CM | POA: Diagnosis not present

## 2024-02-16 DIAGNOSIS — M51379 Other intervertebral disc degeneration, lumbosacral region without mention of lumbar back pain or lower extremity pain: Secondary | ICD-10-CM | POA: Diagnosis not present

## 2024-02-16 DIAGNOSIS — M5459 Other low back pain: Secondary | ICD-10-CM | POA: Diagnosis not present

## 2024-02-16 DIAGNOSIS — G5791 Unspecified mononeuropathy of right lower limb: Secondary | ICD-10-CM | POA: Diagnosis not present

## 2024-02-16 DIAGNOSIS — Z79899 Other long term (current) drug therapy: Secondary | ICD-10-CM | POA: Diagnosis not present

## 2024-02-21 DIAGNOSIS — E119 Type 2 diabetes mellitus without complications: Secondary | ICD-10-CM | POA: Diagnosis not present

## 2024-02-26 DIAGNOSIS — R319 Hematuria, unspecified: Secondary | ICD-10-CM | POA: Diagnosis not present

## 2024-02-26 DIAGNOSIS — F17210 Nicotine dependence, cigarettes, uncomplicated: Secondary | ICD-10-CM | POA: Diagnosis not present

## 2024-02-26 DIAGNOSIS — Z809 Family history of malignant neoplasm, unspecified: Secondary | ICD-10-CM | POA: Diagnosis not present

## 2024-02-26 DIAGNOSIS — N2889 Other specified disorders of kidney and ureter: Secondary | ICD-10-CM | POA: Diagnosis not present

## 2024-02-26 DIAGNOSIS — E871 Hypo-osmolality and hyponatremia: Secondary | ICD-10-CM | POA: Diagnosis not present

## 2024-02-26 DIAGNOSIS — E119 Type 2 diabetes mellitus without complications: Secondary | ICD-10-CM | POA: Diagnosis not present

## 2024-02-26 DIAGNOSIS — Z299 Encounter for prophylactic measures, unspecified: Secondary | ICD-10-CM | POA: Diagnosis not present

## 2024-02-26 DIAGNOSIS — R3912 Poor urinary stream: Secondary | ICD-10-CM | POA: Diagnosis not present

## 2024-02-26 DIAGNOSIS — I10 Essential (primary) hypertension: Secondary | ICD-10-CM | POA: Diagnosis not present

## 2024-02-26 DIAGNOSIS — E878 Other disorders of electrolyte and fluid balance, not elsewhere classified: Secondary | ICD-10-CM | POA: Diagnosis not present

## 2024-02-26 DIAGNOSIS — D72829 Elevated white blood cell count, unspecified: Secondary | ICD-10-CM | POA: Diagnosis not present

## 2024-02-26 DIAGNOSIS — E785 Hyperlipidemia, unspecified: Secondary | ICD-10-CM | POA: Diagnosis not present

## 2024-02-26 DIAGNOSIS — Z905 Acquired absence of kidney: Secondary | ICD-10-CM | POA: Diagnosis not present

## 2024-02-26 DIAGNOSIS — E1140 Type 2 diabetes mellitus with diabetic neuropathy, unspecified: Secondary | ICD-10-CM | POA: Diagnosis not present

## 2024-02-26 DIAGNOSIS — E1165 Type 2 diabetes mellitus with hyperglycemia: Secondary | ICD-10-CM | POA: Diagnosis not present

## 2024-02-27 DIAGNOSIS — R319 Hematuria, unspecified: Secondary | ICD-10-CM | POA: Diagnosis not present

## 2024-02-27 DIAGNOSIS — Z905 Acquired absence of kidney: Secondary | ICD-10-CM | POA: Diagnosis not present

## 2024-02-27 DIAGNOSIS — N2889 Other specified disorders of kidney and ureter: Secondary | ICD-10-CM | POA: Diagnosis not present

## 2024-03-15 DIAGNOSIS — M51379 Other intervertebral disc degeneration, lumbosacral region without mention of lumbar back pain or lower extremity pain: Secondary | ICD-10-CM | POA: Diagnosis not present

## 2024-03-15 DIAGNOSIS — M5459 Other low back pain: Secondary | ICD-10-CM | POA: Diagnosis not present

## 2024-03-15 DIAGNOSIS — G5791 Unspecified mononeuropathy of right lower limb: Secondary | ICD-10-CM | POA: Diagnosis not present

## 2024-03-15 DIAGNOSIS — C649 Malignant neoplasm of unspecified kidney, except renal pelvis: Secondary | ICD-10-CM | POA: Diagnosis not present

## 2024-03-15 DIAGNOSIS — Z79899 Other long term (current) drug therapy: Secondary | ICD-10-CM | POA: Diagnosis not present

## 2024-03-15 DIAGNOSIS — M47812 Spondylosis without myelopathy or radiculopathy, cervical region: Secondary | ICD-10-CM | POA: Diagnosis not present

## 2024-03-16 DIAGNOSIS — C642 Malignant neoplasm of left kidney, except renal pelvis: Secondary | ICD-10-CM | POA: Diagnosis not present

## 2024-03-16 DIAGNOSIS — Z1389 Encounter for screening for other disorder: Secondary | ICD-10-CM | POA: Diagnosis not present

## 2024-03-16 DIAGNOSIS — Z299 Encounter for prophylactic measures, unspecified: Secondary | ICD-10-CM | POA: Diagnosis not present

## 2024-03-16 DIAGNOSIS — R5383 Other fatigue: Secondary | ICD-10-CM | POA: Diagnosis not present

## 2024-03-16 DIAGNOSIS — F17210 Nicotine dependence, cigarettes, uncomplicated: Secondary | ICD-10-CM | POA: Diagnosis not present

## 2024-03-16 DIAGNOSIS — I10 Essential (primary) hypertension: Secondary | ICD-10-CM | POA: Diagnosis not present

## 2024-03-16 DIAGNOSIS — Z0000 Encounter for general adult medical examination without abnormal findings: Secondary | ICD-10-CM | POA: Diagnosis not present

## 2024-03-16 DIAGNOSIS — Z7189 Other specified counseling: Secondary | ICD-10-CM | POA: Diagnosis not present

## 2024-03-22 DIAGNOSIS — E119 Type 2 diabetes mellitus without complications: Secondary | ICD-10-CM | POA: Diagnosis not present

## 2024-04-07 DIAGNOSIS — C642 Malignant neoplasm of left kidney, except renal pelvis: Secondary | ICD-10-CM | POA: Diagnosis not present

## 2024-04-07 DIAGNOSIS — R918 Other nonspecific abnormal finding of lung field: Secondary | ICD-10-CM | POA: Diagnosis not present

## 2024-04-07 DIAGNOSIS — J432 Centrilobular emphysema: Secondary | ICD-10-CM | POA: Diagnosis not present

## 2024-04-07 DIAGNOSIS — N289 Disorder of kidney and ureter, unspecified: Secondary | ICD-10-CM | POA: Diagnosis not present

## 2024-04-08 DIAGNOSIS — R310 Gross hematuria: Secondary | ICD-10-CM | POA: Diagnosis not present

## 2024-04-08 DIAGNOSIS — C642 Malignant neoplasm of left kidney, except renal pelvis: Secondary | ICD-10-CM | POA: Diagnosis not present

## 2024-04-15 DIAGNOSIS — C649 Malignant neoplasm of unspecified kidney, except renal pelvis: Secondary | ICD-10-CM | POA: Diagnosis not present

## 2024-04-15 DIAGNOSIS — Z79899 Other long term (current) drug therapy: Secondary | ICD-10-CM | POA: Diagnosis not present

## 2024-04-15 DIAGNOSIS — G5791 Unspecified mononeuropathy of right lower limb: Secondary | ICD-10-CM | POA: Diagnosis not present

## 2024-04-15 DIAGNOSIS — M51379 Other intervertebral disc degeneration, lumbosacral region without mention of lumbar back pain or lower extremity pain: Secondary | ICD-10-CM | POA: Diagnosis not present

## 2024-04-15 DIAGNOSIS — M47812 Spondylosis without myelopathy or radiculopathy, cervical region: Secondary | ICD-10-CM | POA: Diagnosis not present

## 2024-04-15 DIAGNOSIS — M5459 Other low back pain: Secondary | ICD-10-CM | POA: Diagnosis not present

## 2024-04-21 DIAGNOSIS — M65331 Trigger finger, right middle finger: Secondary | ICD-10-CM | POA: Diagnosis not present

## 2024-04-21 DIAGNOSIS — E119 Type 2 diabetes mellitus without complications: Secondary | ICD-10-CM | POA: Diagnosis not present

## 2024-04-21 DIAGNOSIS — I10 Essential (primary) hypertension: Secondary | ICD-10-CM | POA: Diagnosis not present

## 2024-04-21 DIAGNOSIS — Z299 Encounter for prophylactic measures, unspecified: Secondary | ICD-10-CM | POA: Diagnosis not present

## 2024-04-21 DIAGNOSIS — R52 Pain, unspecified: Secondary | ICD-10-CM | POA: Diagnosis not present

## 2024-04-22 DIAGNOSIS — E119 Type 2 diabetes mellitus without complications: Secondary | ICD-10-CM | POA: Diagnosis not present

## 2024-04-28 DIAGNOSIS — I10 Essential (primary) hypertension: Secondary | ICD-10-CM | POA: Diagnosis not present

## 2024-04-28 DIAGNOSIS — E785 Hyperlipidemia, unspecified: Secondary | ICD-10-CM | POA: Diagnosis not present

## 2024-04-28 DIAGNOSIS — R310 Gross hematuria: Secondary | ICD-10-CM | POA: Diagnosis not present

## 2024-04-28 DIAGNOSIS — N329 Bladder disorder, unspecified: Secondary | ICD-10-CM | POA: Diagnosis not present

## 2024-04-28 DIAGNOSIS — E1140 Type 2 diabetes mellitus with diabetic neuropathy, unspecified: Secondary | ICD-10-CM | POA: Diagnosis not present

## 2024-04-28 DIAGNOSIS — Z905 Acquired absence of kidney: Secondary | ICD-10-CM | POA: Diagnosis not present

## 2024-04-28 DIAGNOSIS — F17210 Nicotine dependence, cigarettes, uncomplicated: Secondary | ICD-10-CM | POA: Diagnosis not present

## 2024-05-05 DIAGNOSIS — R319 Hematuria, unspecified: Secondary | ICD-10-CM | POA: Diagnosis not present

## 2024-05-05 DIAGNOSIS — E1169 Type 2 diabetes mellitus with other specified complication: Secondary | ICD-10-CM | POA: Diagnosis not present

## 2024-05-05 DIAGNOSIS — C642 Malignant neoplasm of left kidney, except renal pelvis: Secondary | ICD-10-CM | POA: Diagnosis not present

## 2024-05-05 DIAGNOSIS — I10 Essential (primary) hypertension: Secondary | ICD-10-CM | POA: Diagnosis not present

## 2024-05-05 DIAGNOSIS — R52 Pain, unspecified: Secondary | ICD-10-CM | POA: Diagnosis not present

## 2024-05-05 DIAGNOSIS — Z299 Encounter for prophylactic measures, unspecified: Secondary | ICD-10-CM | POA: Diagnosis not present

## 2024-05-17 DIAGNOSIS — M5459 Other low back pain: Secondary | ICD-10-CM | POA: Diagnosis not present

## 2024-05-17 DIAGNOSIS — M47812 Spondylosis without myelopathy or radiculopathy, cervical region: Secondary | ICD-10-CM | POA: Diagnosis not present

## 2024-05-17 DIAGNOSIS — G5791 Unspecified mononeuropathy of right lower limb: Secondary | ICD-10-CM | POA: Diagnosis not present

## 2024-05-17 DIAGNOSIS — C649 Malignant neoplasm of unspecified kidney, except renal pelvis: Secondary | ICD-10-CM | POA: Diagnosis not present

## 2024-05-17 DIAGNOSIS — M51379 Other intervertebral disc degeneration, lumbosacral region without mention of lumbar back pain or lower extremity pain: Secondary | ICD-10-CM | POA: Diagnosis not present

## 2024-05-17 DIAGNOSIS — Z79899 Other long term (current) drug therapy: Secondary | ICD-10-CM | POA: Diagnosis not present

## 2024-05-22 DIAGNOSIS — E119 Type 2 diabetes mellitus without complications: Secondary | ICD-10-CM | POA: Diagnosis not present

## 2024-06-03 DIAGNOSIS — C672 Malignant neoplasm of lateral wall of bladder: Secondary | ICD-10-CM | POA: Diagnosis not present

## 2024-06-03 DIAGNOSIS — E1122 Type 2 diabetes mellitus with diabetic chronic kidney disease: Secondary | ICD-10-CM | POA: Diagnosis not present

## 2024-06-03 DIAGNOSIS — Z7985 Long-term (current) use of injectable non-insulin antidiabetic drugs: Secondary | ICD-10-CM | POA: Diagnosis not present

## 2024-06-03 DIAGNOSIS — Z85528 Personal history of other malignant neoplasm of kidney: Secondary | ICD-10-CM | POA: Diagnosis not present

## 2024-06-03 DIAGNOSIS — E785 Hyperlipidemia, unspecified: Secondary | ICD-10-CM | POA: Diagnosis not present

## 2024-06-03 DIAGNOSIS — N189 Chronic kidney disease, unspecified: Secondary | ICD-10-CM | POA: Diagnosis not present

## 2024-06-03 DIAGNOSIS — Z79899 Other long term (current) drug therapy: Secondary | ICD-10-CM | POA: Diagnosis not present

## 2024-06-03 DIAGNOSIS — I129 Hypertensive chronic kidney disease with stage 1 through stage 4 chronic kidney disease, or unspecified chronic kidney disease: Secondary | ICD-10-CM | POA: Diagnosis not present

## 2024-06-03 DIAGNOSIS — R310 Gross hematuria: Secondary | ICD-10-CM | POA: Diagnosis not present

## 2024-06-03 DIAGNOSIS — Z7984 Long term (current) use of oral hypoglycemic drugs: Secondary | ICD-10-CM | POA: Diagnosis not present

## 2024-06-03 DIAGNOSIS — N3289 Other specified disorders of bladder: Secondary | ICD-10-CM | POA: Diagnosis not present

## 2024-06-03 DIAGNOSIS — Z7982 Long term (current) use of aspirin: Secondary | ICD-10-CM | POA: Diagnosis not present

## 2024-06-03 DIAGNOSIS — Z794 Long term (current) use of insulin: Secondary | ICD-10-CM | POA: Diagnosis not present

## 2024-06-03 DIAGNOSIS — C679 Malignant neoplasm of bladder, unspecified: Secondary | ICD-10-CM | POA: Diagnosis not present

## 2024-06-03 DIAGNOSIS — I10 Essential (primary) hypertension: Secondary | ICD-10-CM | POA: Diagnosis not present

## 2024-06-03 DIAGNOSIS — E1142 Type 2 diabetes mellitus with diabetic polyneuropathy: Secondary | ICD-10-CM | POA: Diagnosis not present

## 2024-06-08 DIAGNOSIS — C679 Malignant neoplasm of bladder, unspecified: Secondary | ICD-10-CM | POA: Diagnosis not present

## 2024-06-09 DIAGNOSIS — C679 Malignant neoplasm of bladder, unspecified: Secondary | ICD-10-CM | POA: Diagnosis not present

## 2024-06-15 DIAGNOSIS — C679 Malignant neoplasm of bladder, unspecified: Secondary | ICD-10-CM | POA: Diagnosis not present

## 2024-06-15 DIAGNOSIS — N289 Disorder of kidney and ureter, unspecified: Secondary | ICD-10-CM | POA: Diagnosis not present

## 2024-06-15 DIAGNOSIS — M799 Soft tissue disorder, unspecified: Secondary | ICD-10-CM | POA: Diagnosis not present

## 2024-06-18 DIAGNOSIS — C679 Malignant neoplasm of bladder, unspecified: Secondary | ICD-10-CM | POA: Diagnosis not present

## 2024-06-19 DIAGNOSIS — G5791 Unspecified mononeuropathy of right lower limb: Secondary | ICD-10-CM | POA: Diagnosis not present

## 2024-06-19 DIAGNOSIS — R208 Other disturbances of skin sensation: Secondary | ICD-10-CM | POA: Diagnosis not present

## 2024-06-19 DIAGNOSIS — M51379 Other intervertebral disc degeneration, lumbosacral region without mention of lumbar back pain or lower extremity pain: Secondary | ICD-10-CM | POA: Diagnosis not present

## 2024-06-19 DIAGNOSIS — M5459 Other low back pain: Secondary | ICD-10-CM | POA: Diagnosis not present

## 2024-06-19 DIAGNOSIS — Z79899 Other long term (current) drug therapy: Secondary | ICD-10-CM | POA: Diagnosis not present

## 2024-06-19 DIAGNOSIS — M47812 Spondylosis without myelopathy or radiculopathy, cervical region: Secondary | ICD-10-CM | POA: Diagnosis not present

## 2024-06-19 DIAGNOSIS — C649 Malignant neoplasm of unspecified kidney, except renal pelvis: Secondary | ICD-10-CM | POA: Diagnosis not present

## 2024-06-19 DIAGNOSIS — T402X5A Adverse effect of other opioids, initial encounter: Secondary | ICD-10-CM | POA: Diagnosis not present

## 2024-06-22 DIAGNOSIS — E119 Type 2 diabetes mellitus without complications: Secondary | ICD-10-CM | POA: Diagnosis not present

## 2024-06-22 DIAGNOSIS — C672 Malignant neoplasm of lateral wall of bladder: Secondary | ICD-10-CM | POA: Diagnosis not present

## 2024-06-22 DIAGNOSIS — C642 Malignant neoplasm of left kidney, except renal pelvis: Secondary | ICD-10-CM | POA: Diagnosis not present

## 2024-06-22 DIAGNOSIS — N2889 Other specified disorders of kidney and ureter: Secondary | ICD-10-CM | POA: Diagnosis not present

## 2024-06-24 DIAGNOSIS — Z79899 Other long term (current) drug therapy: Secondary | ICD-10-CM | POA: Diagnosis not present

## 2024-06-24 DIAGNOSIS — M47812 Spondylosis without myelopathy or radiculopathy, cervical region: Secondary | ICD-10-CM | POA: Diagnosis not present

## 2024-06-24 DIAGNOSIS — E1140 Type 2 diabetes mellitus with diabetic neuropathy, unspecified: Secondary | ICD-10-CM | POA: Diagnosis not present

## 2024-06-24 DIAGNOSIS — C9511 Chronic leukemia of unspecified cell type, in remission: Secondary | ICD-10-CM | POA: Diagnosis not present

## 2024-06-28 DIAGNOSIS — C678 Malignant neoplasm of overlapping sites of bladder: Secondary | ICD-10-CM | POA: Diagnosis not present

## 2024-06-28 DIAGNOSIS — Z79899 Other long term (current) drug therapy: Secondary | ICD-10-CM | POA: Diagnosis not present

## 2024-07-16 DIAGNOSIS — J449 Chronic obstructive pulmonary disease, unspecified: Secondary | ICD-10-CM | POA: Diagnosis not present

## 2024-07-16 DIAGNOSIS — M5459 Other low back pain: Secondary | ICD-10-CM | POA: Diagnosis not present

## 2024-07-16 DIAGNOSIS — M25512 Pain in left shoulder: Secondary | ICD-10-CM | POA: Diagnosis not present

## 2024-07-16 DIAGNOSIS — M48061 Spinal stenosis, lumbar region without neurogenic claudication: Secondary | ICD-10-CM | POA: Diagnosis not present

## 2024-07-16 DIAGNOSIS — R110 Nausea: Secondary | ICD-10-CM | POA: Diagnosis not present

## 2024-07-16 DIAGNOSIS — M47812 Spondylosis without myelopathy or radiculopathy, cervical region: Secondary | ICD-10-CM | POA: Diagnosis not present

## 2024-07-16 DIAGNOSIS — C9511 Chronic leukemia of unspecified cell type, in remission: Secondary | ICD-10-CM | POA: Diagnosis not present

## 2024-07-16 DIAGNOSIS — C649 Malignant neoplasm of unspecified kidney, except renal pelvis: Secondary | ICD-10-CM | POA: Diagnosis not present

## 2024-07-19 DIAGNOSIS — C672 Malignant neoplasm of lateral wall of bladder: Secondary | ICD-10-CM | POA: Diagnosis not present

## 2024-07-19 DIAGNOSIS — C642 Malignant neoplasm of left kidney, except renal pelvis: Secondary | ICD-10-CM | POA: Diagnosis not present
